# Patient Record
Sex: Female | Born: 1960 | Race: White | Hispanic: No | Marital: Married | Smoking: Former smoker
Health system: Southern US, Community
[De-identification: ages and names within clinical notes are randomized; demographics above are authoritative.]

## PROBLEM LIST (undated history)

## (undated) DIAGNOSIS — K219 Gastro-esophageal reflux disease without esophagitis: Secondary | ICD-10-CM

## (undated) DIAGNOSIS — F419 Anxiety disorder, unspecified: Secondary | ICD-10-CM

## (undated) DIAGNOSIS — F329 Major depressive disorder, single episode, unspecified: Secondary | ICD-10-CM

## (undated) DIAGNOSIS — K589 Irritable bowel syndrome without diarrhea: Secondary | ICD-10-CM

## (undated) DIAGNOSIS — D649 Anemia, unspecified: Secondary | ICD-10-CM

## (undated) DIAGNOSIS — F32A Depression, unspecified: Secondary | ICD-10-CM

## (undated) DIAGNOSIS — K56609 Unspecified intestinal obstruction, unspecified as to partial versus complete obstruction: Secondary | ICD-10-CM

## (undated) DIAGNOSIS — S0300XA Dislocation of jaw, unspecified side, initial encounter: Secondary | ICD-10-CM

## (undated) DIAGNOSIS — C539 Malignant neoplasm of cervix uteri, unspecified: Secondary | ICD-10-CM

## (undated) DIAGNOSIS — M1990 Unspecified osteoarthritis, unspecified site: Secondary | ICD-10-CM

## (undated) DIAGNOSIS — K602 Anal fissure, unspecified: Secondary | ICD-10-CM

## (undated) DIAGNOSIS — E785 Hyperlipidemia, unspecified: Secondary | ICD-10-CM

## (undated) DIAGNOSIS — I10 Essential (primary) hypertension: Secondary | ICD-10-CM

## (undated) DIAGNOSIS — H409 Unspecified glaucoma: Secondary | ICD-10-CM

## (undated) HISTORY — DX: Anxiety disorder, unspecified: F41.9

## (undated) HISTORY — DX: Unspecified glaucoma: H40.9

## (undated) HISTORY — DX: Major depressive disorder, single episode, unspecified: F32.9

## (undated) HISTORY — DX: Hyperlipidemia, unspecified: E78.5

## (undated) HISTORY — PX: MYOMECTOMY: SHX85

## (undated) HISTORY — DX: Irritable bowel syndrome, unspecified: K58.9

## (undated) HISTORY — DX: Anal fissure, unspecified: K60.2

## (undated) HISTORY — DX: Depression, unspecified: F32.A

## (undated) HISTORY — PX: COLECTOMY: SHX59

## (undated) HISTORY — DX: Dislocation of jaw, unspecified side, initial encounter: S03.00XA

## (undated) HISTORY — PX: CERVICAL CONE BIOPSY: SUR198

## (undated) HISTORY — DX: Unspecified intestinal obstruction, unspecified as to partial versus complete obstruction: K56.609

## (undated) HISTORY — DX: Essential (primary) hypertension: I10

## (undated) HISTORY — DX: Unspecified osteoarthritis, unspecified site: M19.90

## (undated) HISTORY — DX: Anemia, unspecified: D64.9

## (undated) HISTORY — DX: Malignant neoplasm of cervix uteri, unspecified: C53.9

## (undated) HISTORY — PX: ANAL FISTULECTOMY: SHX1139

## (undated) HISTORY — DX: Gastro-esophageal reflux disease without esophagitis: K21.9

---

## 1999-08-27 ENCOUNTER — Observation Stay (HOSPITAL_COMMUNITY): Admission: EM | Admit: 1999-08-27 | Discharge: 1999-08-28 | Payer: Self-pay | Admitting: Emergency Medicine

## 1999-09-01 ENCOUNTER — Encounter (INDEPENDENT_AMBULATORY_CARE_PROVIDER_SITE_OTHER): Payer: Self-pay

## 1999-09-01 ENCOUNTER — Other Ambulatory Visit: Admission: RE | Admit: 1999-09-01 | Discharge: 1999-09-01 | Payer: Self-pay | Admitting: Internal Medicine

## 2001-11-30 DIAGNOSIS — D649 Anemia, unspecified: Secondary | ICD-10-CM

## 2001-11-30 HISTORY — DX: Anemia, unspecified: D64.9

## 2002-11-13 ENCOUNTER — Other Ambulatory Visit: Admission: RE | Admit: 2002-11-13 | Discharge: 2002-11-13 | Payer: Self-pay | Admitting: Family Medicine

## 2007-05-04 ENCOUNTER — Other Ambulatory Visit: Admission: RE | Admit: 2007-05-04 | Discharge: 2007-05-04 | Payer: Self-pay | Admitting: Family Medicine

## 2011-02-17 ENCOUNTER — Other Ambulatory Visit (HOSPITAL_COMMUNITY)
Admission: RE | Admit: 2011-02-17 | Discharge: 2011-02-17 | Disposition: A | Discharge status: Home / Self Care | Payer: BC Managed Care – PPO | Source: Ambulatory Visit | Attending: Family Medicine | Admitting: Family Medicine

## 2011-02-17 ENCOUNTER — Other Ambulatory Visit: Payer: Self-pay | Admitting: Family Medicine

## 2011-02-17 DIAGNOSIS — Z1159 Encounter for screening for other viral diseases: Secondary | ICD-10-CM | POA: Insufficient documentation

## 2011-02-17 DIAGNOSIS — Z124 Encounter for screening for malignant neoplasm of cervix: Secondary | ICD-10-CM | POA: Insufficient documentation

## 2012-04-22 ENCOUNTER — Encounter: Payer: Self-pay | Admitting: Internal Medicine

## 2012-06-06 ENCOUNTER — Ambulatory Visit (INDEPENDENT_AMBULATORY_CARE_PROVIDER_SITE_OTHER): Payer: BC Managed Care – PPO | Admitting: Internal Medicine

## 2012-06-06 ENCOUNTER — Encounter: Payer: Self-pay | Admitting: Internal Medicine

## 2012-06-06 VITALS — BP 144/82 | HR 76 | Ht 64.0 in | Wt 142.6 lb

## 2012-06-06 DIAGNOSIS — R197 Diarrhea, unspecified: Secondary | ICD-10-CM

## 2012-06-06 DIAGNOSIS — R159 Full incontinence of feces: Secondary | ICD-10-CM

## 2012-06-06 DIAGNOSIS — K219 Gastro-esophageal reflux disease without esophagitis: Secondary | ICD-10-CM

## 2012-06-06 MED ORDER — CHOLESTYRAMINE 4 G PO PACK: PACK | ORAL | Status: DC

## 2012-06-06 NOTE — Progress Notes (Signed)
HISTORY OF PRESENT ILLNESS:  Natasha Chambers is a 51 y.o. female who is sent today regarding chronic diarrhea and fecal incontinence. Patient tells me that she has a history of an eating disorder remotely. She used laxatives chronically. Subsequently developed problems with chronic constipation and recurrent fecal impactions. Was evaluated by physicians in [place] approximately 20 years ago. Apparently had a grossly abnormal sitz marker test. 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 Ever since, she has had chronic diarrhea. 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 Ever since that time, she has had incontinence. She was apparently seen in this office about 10 years ago (old records purged, requested). She has a number of psychiatric issues including anxiety and depression. She does see a psychiatrist. She has stopped working and has applied for disability. She has tried Imodium and fiber. This will improve her bowel habits. However she describes some abdominal discomfort and fear of impaction. Subsequently has explosive bowel movements which are loose. On average, she states that she makes about 15 trips to the bathroom. Generally small volume bowel movements. She has had no weight loss. Review of outside records from last month showed a normal comprehensive metabolic panel. Throughout the course of the interview the patient rambles and is tearful. GI review of systems is essentially positive in its entirety except for dysphagia and GI bleeding. She does have chronic GERD for which she takes Prilosec. This is effective.  REVIEW OF SYSTEMS:  All non-GI ROS negative except for anxiety, arthritis, neck pain, visual change due to macular degeneration, confusion, depression, fatigue, insomnia, excessive thirst  Past Medical History  Diagnosis Date  . IBS (irritable bowel syndrome)   . Anemia 2003  . Arthritis     . Cervical cancer   . Anxiety and depression   . Hypertension     Past Surgical History  Procedure Date  . Colectomy   . Cervical cone biopsy   . Myomectomy   . Anal fistulectomy     Social History Natasha Chambers  reports that she has quit smoking. She has never used smokeless tobacco. She reports that she drinks alcohol. She reports that she does not use illicit drugs.  family history includes Diabetes in her brother and father; Heart disease in her father; Lung cancer in her mother; and Prostate cancer in her father.  No Known Allergies     PHYSICAL EXAMINATION: Vital signs: BP 144/82  Pulse 76  Ht 5\' 4"  (1.626 m)  Wt 142 lb 9.6 oz (64.683 kg)  BMI 24.48 kg/m2  Constitutional: generally well-appearing, no acute distress Psychiatric: alert and oriented x3, cooperative. Anxious and intermittently tearful Eyes: extraocular movements intact, anicteric, conjunctiva pink Mouth: oral pharynx moist, no lesions Neck: supple no lymphadenopathy Cardiovascular: heart regular rate and rhythm, no murmur Lungs: clear to auscultation bilaterally Abdomen: soft, nontender, nondistended, no obvious ascites, no peritoneal signs, normal bowel sounds, no organomegaly. Prior surgical incision well healed. No hernias. Rectal:no external abnormalities. Absent sphincter tone. Brown soft Hemoccult negative stool Extremities: no lower extremity edema bilaterally Skin: no lesions on visible extremities Neuro: No focal deficits.   ASSESSMENT:  #1. Diarrhea. This is secondary to total colectomy. This can be controlled with medications, though she will develop some abdominal discomfort and fears developing constipation #2. Fecal incontinence. This is secondary to lack of sphincter tone. May be the result of prior anal fistula problems or surgery #3. GERD. Controlled with PPI.  No alarm symptoms #4. Psychiatric issues including prior history of eating disorder and ongoing problems with anxiety  and depression. A major factor  PLAN:  #1. Discussed ways to try to improve stool consistency, but avoid constipation. A be titrating fiber or Imodium to lower doses as frequently. As well, I have prescribed Questran for her to try. This may be titrated as well. 45 minutes was spent with this patient today. The majority of the time counseling regarding her GI issues. #2. Where protective undergarments. As well schedule bathroom times #3. We did discuss ileostomy as an option. Also discussed that this would be anyone set of issues for a different set of issues. She is apparently aware of this option and has researched previously. Not interested at present #4. Reflux precautions and continue lowest dose of PPI to control heartburn #5. Continue to work with her psychiatrist #6. Return to the care of Dr. Clarene Duke. GI followup when necessary

## 2012-06-06 NOTE — Patient Instructions (Addendum)
We have sent the following medications to your pharmacy for you to pick up at your convenience: Questran 

## 2016-12-22 ENCOUNTER — Other Ambulatory Visit: Payer: Self-pay | Admitting: Dentistry

## 2016-12-22 DIAGNOSIS — R6884 Jaw pain: Secondary | ICD-10-CM

## 2016-12-26 ENCOUNTER — Ambulatory Visit
Admission: RE | Admit: 2016-12-26 | Discharge: 2016-12-26 | Disposition: A | Discharge status: Home / Self Care | Payer: Self-pay | Source: Ambulatory Visit | Attending: Dentistry | Admitting: Dentistry

## 2016-12-26 DIAGNOSIS — R6884 Jaw pain: Secondary | ICD-10-CM

## 2018-02-11 IMAGING — MR MR [PERSON_NAME]
7 series · 40 of 40 positions shown · non-contrast
Comparison: None.

CLINICAL DATA: Bilateral jaw pain, right greater than left, for 6
months.

EXAM:
MRI OF TEMPOROMANDIBULAR JOINT WITHOUT CONTRAST
TECHNIQUE: Multiplanar, multisequence MR imaging of the temporomandibular joint
was performed in the closed mouth position and using a 23 mm bite
block. No intravenous contrast was administered.

[Series 5: PD · coronal · 4.0mm · 0.44mm/px · 6 of 18 slices shown (1 of 5)]
[im 1/18]
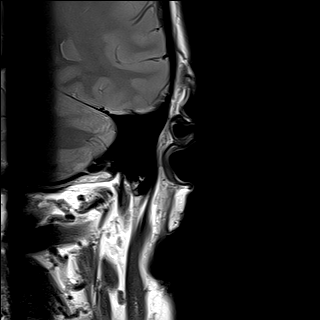
[im 4/18]
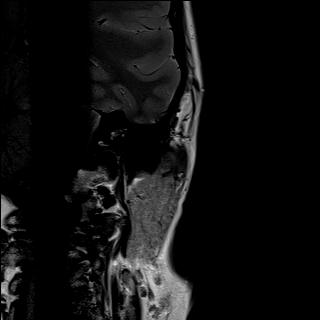
[im 7/18]
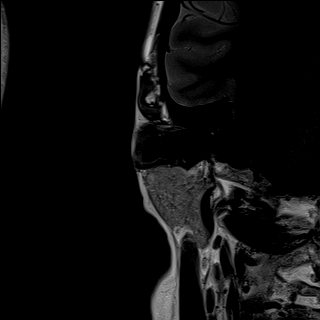
[im 11/18]
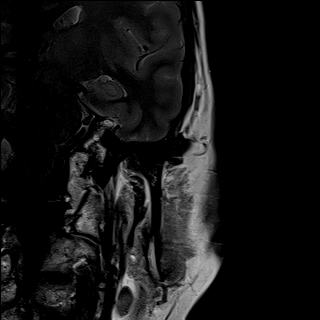
[im 14/18]
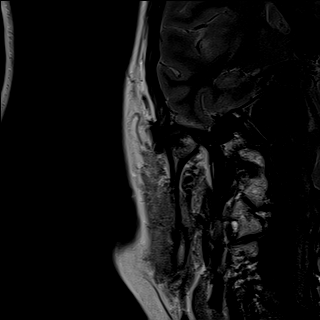
[im 18/18]
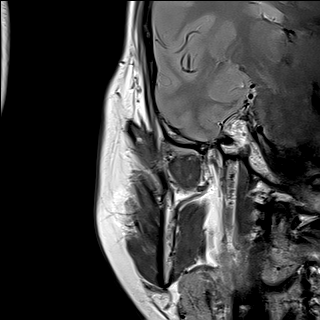

[Series 6: T2 fat-sat · sagittal · 4.0mm · 0.62mm/px · 6 of 18 slices shown]
[im 1/18]
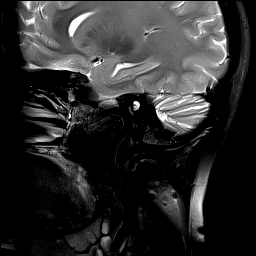
[im 4/18]
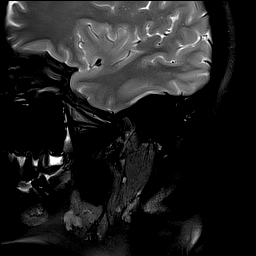
[im 7/18]
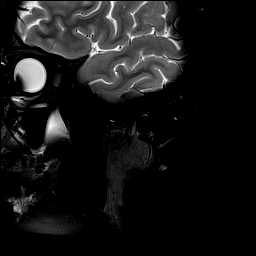
[im 11/18]
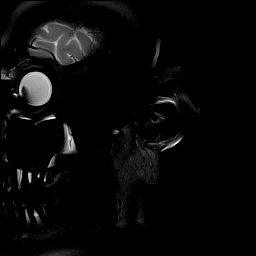
[im 14/18]
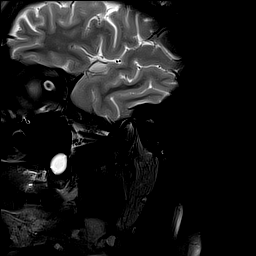
[im 18/18]
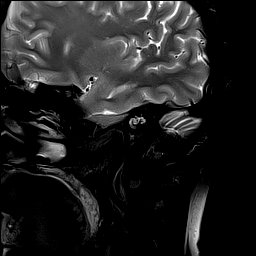

[Series 7: PD · sagittal · 4.0mm · 0.44mm/px · 6 of 18 slices shown (2 of 5)]
[im 1/18]
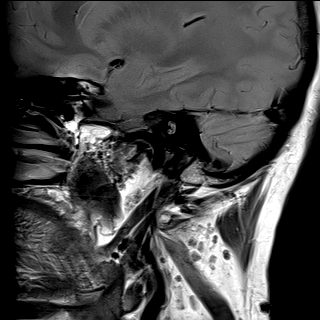
[im 4/18]
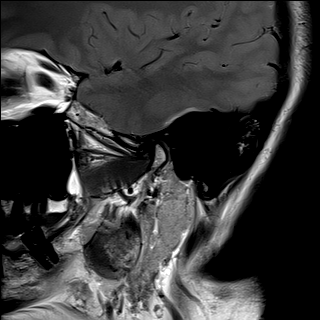
[im 7/18]
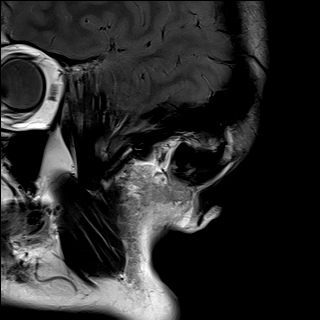
[im 11/18]
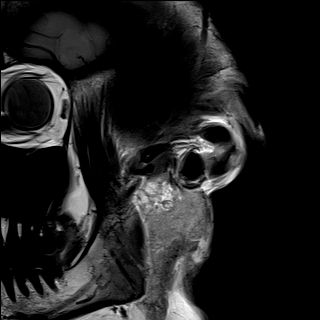
[im 14/18]
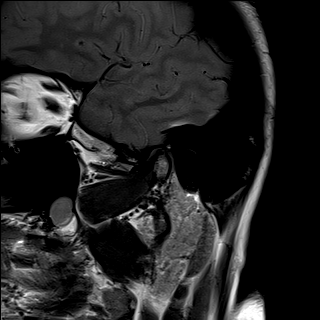
[im 18/18]
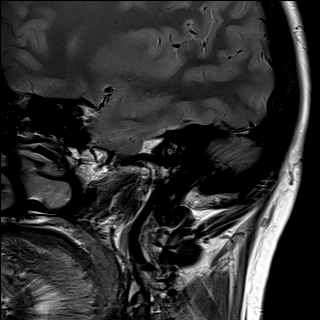

[Series 8: T1 · axial · 4.0mm · 0.59mm/px · z∈[-18,+30]mm · 4 of 13 slices shown]
[im 1/13]
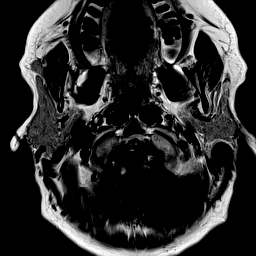
[im 5/13]
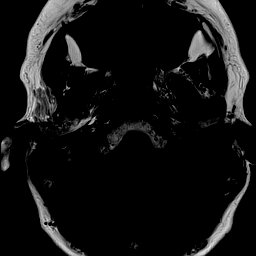
[im 9/13]
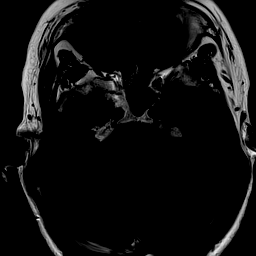
[im 13/13]
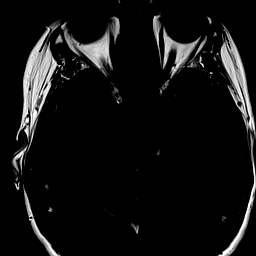

[Series 10: PD · sagittal · 4.0mm · 0.44mm/px · 6 of 18 slices shown (3 of 5)]
[im 1/18]
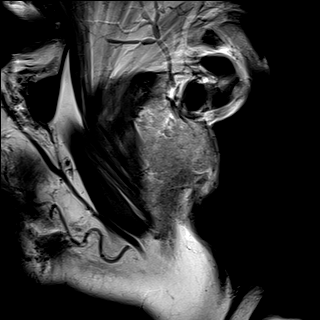
[im 4/18]
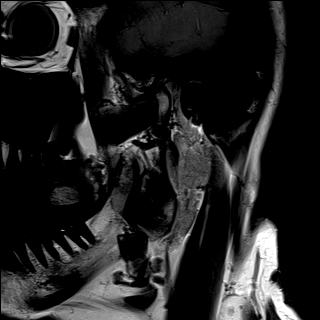
[im 7/18]
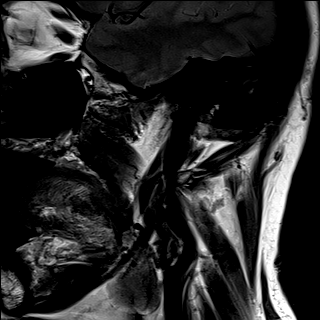
[im 11/18]
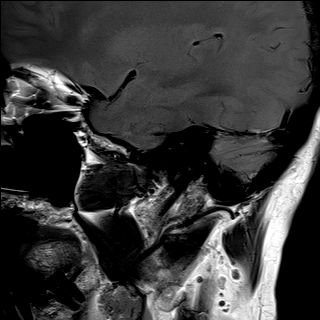
[im 14/18]
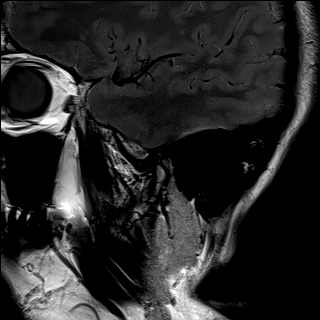
[im 18/18]
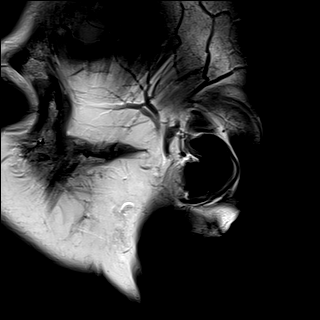

[Series 11: PD · coronal · 4.0mm · 0.44mm/px · 6 of 18 slices shown (4 of 5)]
[im 1/18]
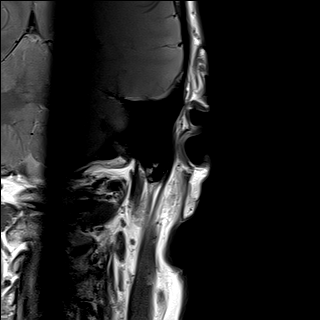
[im 4/18]
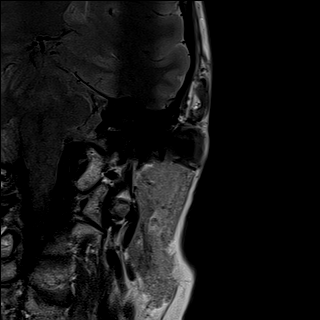
[im 7/18]
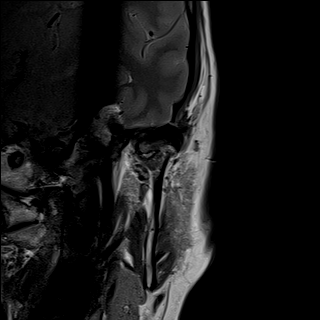
[im 11/18]
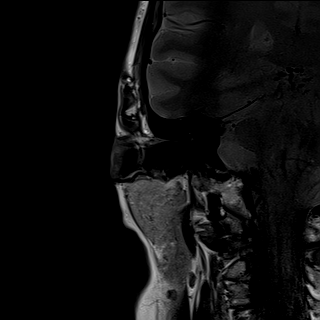
[im 14/18]
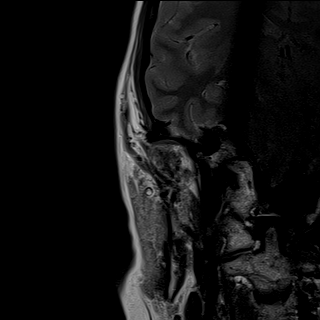
[im 18/18]
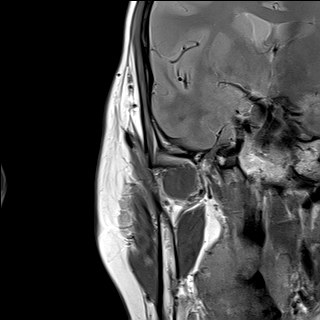

[Series 12: PD · sagittal · 4.0mm · 0.44mm/px · 6 of 18 slices shown (5 of 5)]
[im 1/18]
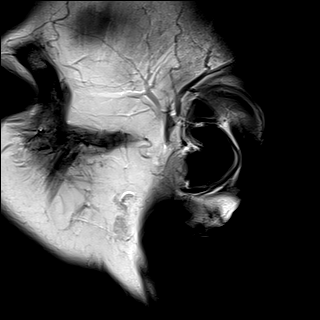
[im 4/18]
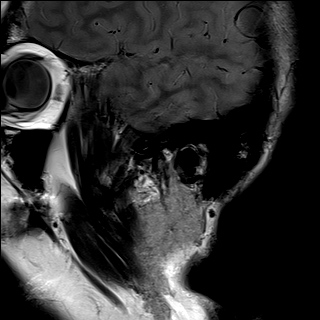
[im 7/18]
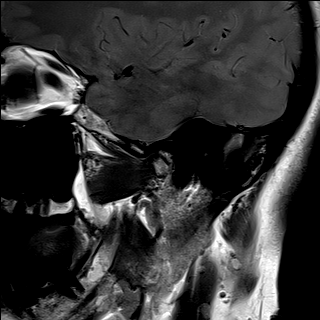
[im 11/18]
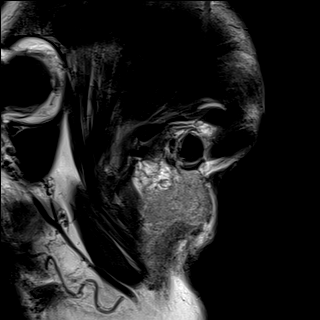
[im 14/18]
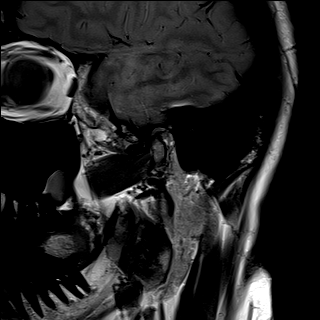
[im 18/18]
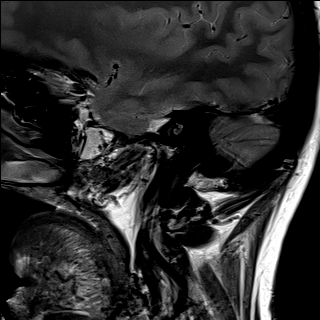

[40 of 40 positions shown; findings below may reference images not displayed]

FINDINGS: There is degenerative cortical irregularity along the right
mandibular condyles with subcortical marrow edema signal shown on
image [DATE] and adjacent images.

On the left side there no significant degenerative findings in the
mandibular condyle.

In the closed mouth position, the right TMJ articular disc is
considerably anteriorly displaced, as shown on images 3-4 of series
7. In the closed-mouth position on the left side the TMJ disc is
mildly abnormally anteriorly displaced.

At 23 mm of mouth opening, the there is only 3 mm of anterior
translation of the right mandibular condyles, and the TMJ disc is
not captured. On the left side, there is only 2 mm of condylar
anterior translation, but the TMJ disc has been captured as shown on
image [DATE].

Other findings include a mucous retention cyst in the right
maxillary sinus and a suspected small left mastoid effusion.
IMPRESSION: 1. Degenerative cortical irregularity and subcortical marrow edema
in the right mandibular condyle.
2. The right TMJ disc is anterior lead dislocated in the
closed-mouth position and remains anteriorly dislocated even at 23
mm of mouth opening. The left TMJ is mildly anteriorly dislocated in
the closed-mouth position, but is re- captured at the 23 mm mouth
opening position.
3. There is reduced anterior translation of both mandibular condyles
with mouth opening.

## 2018-02-18 ENCOUNTER — Other Ambulatory Visit (HOSPITAL_COMMUNITY)
Admission: RE | Admit: 2018-02-18 | Discharge: 2018-02-18 | Disposition: A | Discharge status: Home / Self Care | Payer: BLUE CROSS/BLUE SHIELD | Source: Ambulatory Visit | Attending: Nurse Practitioner | Admitting: Nurse Practitioner

## 2018-02-18 ENCOUNTER — Ambulatory Visit (INDEPENDENT_AMBULATORY_CARE_PROVIDER_SITE_OTHER): Payer: BLUE CROSS/BLUE SHIELD | Admitting: Nurse Practitioner

## 2018-02-18 ENCOUNTER — Encounter: Payer: Self-pay | Admitting: Nurse Practitioner

## 2018-02-18 ENCOUNTER — Telehealth: Payer: Self-pay | Admitting: Internal Medicine

## 2018-02-18 VITALS — BP 132/86 | HR 86 | Temp 98.5°F | Ht 64.0 in | Wt 148.4 lb

## 2018-02-18 DIAGNOSIS — Z0000 Encounter for general adult medical examination without abnormal findings: Secondary | ICD-10-CM | POA: Insufficient documentation

## 2018-02-18 DIAGNOSIS — K529 Noninfective gastroenteritis and colitis, unspecified: Secondary | ICD-10-CM | POA: Diagnosis not present

## 2018-02-18 DIAGNOSIS — Z23 Encounter for immunization: Secondary | ICD-10-CM | POA: Diagnosis not present

## 2018-02-18 DIAGNOSIS — N952 Postmenopausal atrophic vaginitis: Secondary | ICD-10-CM | POA: Insufficient documentation

## 2018-02-18 DIAGNOSIS — Z124 Encounter for screening for malignant neoplasm of cervix: Secondary | ICD-10-CM | POA: Insufficient documentation

## 2018-02-18 DIAGNOSIS — Z8739 Personal history of other diseases of the musculoskeletal system and connective tissue: Secondary | ICD-10-CM | POA: Insufficient documentation

## 2018-02-18 DIAGNOSIS — E782 Mixed hyperlipidemia: Secondary | ICD-10-CM | POA: Diagnosis not present

## 2018-02-18 DIAGNOSIS — Z0001 Encounter for general adult medical examination with abnormal findings: Secondary | ICD-10-CM | POA: Diagnosis not present

## 2018-02-18 DIAGNOSIS — Z9049 Acquired absence of other specified parts of digestive tract: Secondary | ICD-10-CM | POA: Diagnosis not present

## 2018-02-18 DIAGNOSIS — Z1159 Encounter for screening for other viral diseases: Secondary | ICD-10-CM

## 2018-02-18 LAB — CBC
HCT: 38.7 % (ref 36.0–46.0)
Hemoglobin: 13.1 g/dL (ref 12.0–15.0)
MCHC: 34 g/dL (ref 30.0–36.0)
MCV: 86.7 fl (ref 78.0–100.0)
PLATELETS: 272 10*3/uL (ref 150.0–400.0)
RBC: 4.46 Mil/uL (ref 3.87–5.11)
RDW: 12.8 % (ref 11.5–15.5)
WBC: 6.8 10*3/uL (ref 4.0–10.5)

## 2018-02-18 LAB — COMPREHENSIVE METABOLIC PANEL
ALBUMIN: 4.3 g/dL (ref 3.5–5.2)
ALT: 45 U/L — AB (ref 0–35)
AST: 23 U/L (ref 0–37)
Alkaline Phosphatase: 55 U/L (ref 39–117)
BILIRUBIN TOTAL: 0.5 mg/dL (ref 0.2–1.2)
BUN: 10 mg/dL (ref 6–23)
CALCIUM: 9.5 mg/dL (ref 8.4–10.5)
CO2: 27 mEq/L (ref 19–32)
CREATININE: 0.84 mg/dL (ref 0.40–1.20)
Chloride: 106 mEq/L (ref 96–112)
GFR: 74.07 mL/min (ref >60.00)
Glucose, Bld: 81 mg/dL (ref 70–99)
Potassium: 4.4 mEq/L (ref 3.5–5.1)
Sodium: 140 mEq/L (ref 135–145)
TOTAL PROTEIN: 6.8 g/dL (ref 6.0–8.3)

## 2018-02-18 LAB — LIPID PANEL
CHOLESTEROL: 213 mg/dL — AB (ref 0–200)
HDL: 58.2 mg/dL (ref >39.00)
NonHDL: 154.74
TRIGLYCERIDES: 244 mg/dL — AB (ref 0.0–149.0)
Total CHOL/HDL Ratio: 4
VLDL: 48.8 mg/dL — ABNORMAL HIGH (ref 0.0–40.0)

## 2018-02-18 LAB — TSH: TSH: 1.29 u[IU]/mL (ref 0.35–4.50)

## 2018-02-18 LAB — LDL CHOLESTEROL, DIRECT: Direct LDL: 109 mg/dL

## 2018-02-18 NOTE — Assessment & Plan Note (Addendum)
Secondary to colectomy, done 2000 (benign biopsy) Per patient current use of Questran. Recurrent dehydration with need for IV fluid rehydration

## 2018-02-18 NOTE — Patient Instructions (Addendum)
Stable CMP, CBC, TSH. Negative Hep. C/ Elevated lipid panel. Maintain heart healthy diet. Will repeat in 58months (fasting).  Sign medical release to get records from Dr. Rex Kras.  Dehydration, Adult Dehydration is a condition in which there is not enough fluid or water in the body. This happens when you lose more fluids than you take in. Important organs, such as the kidneys, brain, and heart, cannot function without a proper amount of fluids. Any loss of fluids from the body can lead to dehydration. Dehydration can range from mild to severe. This condition should be treated right away to prevent it from becoming severe. What are the causes? This condition may be caused by:  Vomiting.  Diarrhea.  Excessive sweating, such as from heat exposure or exercise.  Not drinking enough fluid, especially: ? When ill. ? While doing activity that requires a lot of energy.  Excessive urination.  Fever.  Infection.  Certain medicines, such as medicines that cause the body to lose excess fluid (diuretics).  Inability to access safe drinking water.  Reduced physical ability to get adequate water and food.  What increases the risk? This condition is more likely to develop in people:  Who have a poorly controlled long-term (chronic) illness, such as diabetes, heart disease, or kidney disease.  Who are age 19 or older.  Who are disabled.  Who live in a place with high altitude.  Who play endurance sports.  What are the signs or symptoms? Symptoms of mild dehydration may include:  Thirst.  Dry lips.  Slightly dry mouth.  Dry, warm skin.  Dizziness. Symptoms of moderate dehydration may include:  Very dry mouth.  Muscle cramps.  Dark urine. Urine may be the color of tea.  Decreased urine production.  Decreased tear production.  Heartbeat that is irregular or faster than normal (palpitations).  Headache.  Light-headedness, especially when you stand up from a sitting  position.  Fainting (syncope). Symptoms of severe dehydration may include:  Changes in skin, such as: ? Cold and clammy skin. ? Blotchy (mottled) or pale skin. ? Skin that does not quickly return to normal after being lightly pinched and released (poor skin turgor).  Changes in body fluids, such as: ? Extreme thirst. ? No tear production. ? Inability to sweat when body temperature is high, such as in hot weather. ? Very little urine production.  Changes in vital signs, such as: ? Weak pulse. ? Pulse that is more than 100 beats a minute when sitting still. ? Rapid breathing. ? Low blood pressure.  Other changes, such as: ? Sunken eyes. ? Cold hands and feet. ? Confusion. ? Lack of energy (lethargy). ? Difficulty waking up from sleep. ? Short-term weight loss. ? Unconsciousness. How is this diagnosed? This condition is diagnosed based on your symptoms and a physical exam. Blood and urine tests may be done to help confirm the diagnosis. How is this treated? Treatment for this condition depends on the severity. Mild or moderate dehydration can often be treated at home. Treatment should be started right away. Do not wait until dehydration becomes severe. Severe dehydration is an emergency and it needs to be treated in a hospital. Treatment for mild dehydration may include:  Drinking more fluids.  Replacing salts and minerals in your blood (electrolytes) that you may have lost. Treatment for moderate dehydration may include:  Drinking an oral rehydration solution (ORS). This is a drink that helps you replace fluids and electrolytes (rehydrate). It can be found at pharmacies and  retail stores. Treatment for severe dehydration may include:  Receiving fluids through an IV tube.  Receiving an electrolyte solution through a feeding tube that is passed through your nose and into your stomach (nasogastric tube, or NG tube).  Correcting any abnormalities in electrolytes.  Treating  the underlying cause of dehydration. Follow these instructions at home:  If directed by your health care provider, drink an ORS: ? Make an ORS by following instructions on the package. ? Start by drinking small amounts, about  cup (120 mL) every 5-10 minutes. ? Slowly increase how much you drink until you have taken the amount recommended by your health care provider.  Drink enough clear fluid to keep your urine clear or pale yellow. If you were told to drink an ORS, finish the ORS first, then start slowly drinking other clear fluids. Drink fluids such as: ? Water. Do not drink only water. Doing that can lead to having too little salt (sodium) in the body (hyponatremia). ? Ice chips. ? Fruit juice that you have added water to (diluted fruit juice). ? Low-calorie sports drinks.  Avoid: ? Alcohol. ? Drinks that contain a lot of sugar. These include high-calorie sports drinks, fruit juice that is not diluted, and soda. ? Caffeine. ? Foods that are greasy or contain a lot of fat or sugar.  Take over-the-counter and prescription medicines only as told by your health care provider.  Do not take sodium tablets. This can lead to having too much sodium in the body (hypernatremia).  Eat foods that contain a healthy balance of electrolytes, such as bananas, oranges, potatoes, tomatoes, and spinach.  Keep all follow-up visits as told by your health care provider. This is important. Contact a health care provider if:  You have abdominal pain that: ? Gets worse. ? Stays in one area (localizes).  You have a rash.  You have a stiff neck.  You are more irritable than usual.  You are sleepier or more difficult to wake up than usual.  You feel weak or dizzy.  You feel very thirsty.  You have urinated only a small amount of very dark urine over 6-8 hours. Get help right away if:  You have symptoms of severe dehydration.  You cannot drink fluids without vomiting.  Your symptoms get  worse with treatment.  You have a fever.  You have a severe headache.  You have vomiting or diarrhea that: ? Gets worse. ? Does not go away.  You have blood or green matter (bile) in your vomit.  You have blood in your stool. This may cause stool to look black and tarry.  You have not urinated in 6-8 hours.  You faint.  Your heart rate while sitting still is over 100 beats a minute.  You have trouble breathing. This information is not intended to replace advice given to you by your health care provider. Make sure you discuss any questions you have with your health care provider. Document Released: 11/16/2005 Document Revised: 06/12/2016 Document Reviewed: 01/10/2016 [employer]vier Interactive Patient Education  [employer].

## 2018-02-18 NOTE — Progress Notes (Signed)
Subjective:    Patient ID: Natasha Chambers, female    DOB: [birth year], 57 y.o.   MRN: [id number]  Patient presents today for complete physical   HPI  Previous pcp: Dr. Rex Kras Under his care for 11yrs Last OV was 2018.  Chronic diarrhea.(liquid to loose) Hx of stool incontinence. 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 Use of questran prn Previous GI (Dr. Henrene Pastor) Needs referral for another [facility] provider.  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  Immunizations: (TDAP, Hep C screen, Pneumovax, Influenza, zoster)  Health Maintenance  Topic Date Due  . Colon Cancer Screening  05/01/2010  . Pap Smear  02/16/2014  . HIV Screening  02/19/2019*  . Mammogram  12/01/2019  . Tetanus Vaccine  02/19/2028  . Flu Shot  Completed  .  Hepatitis C: One time screening is recommended by Center for Disease Control  (CDC) for  adults born from [birth year] through 1966.   Completed  *Topic was postponed. The date shown is not the original due date.   Diet:regular.  Weight:  Wt Readings from Last 3 Encounters:  02/18/18 148 lb 6.4 oz (67.3 kg)  06/06/12 142 lb 9.6 oz (64.7 kg)   Exercise:none.  Fall Risk: Fall Risk  02/18/2018  Falls in the past year? Yes  Number falls in past yr: 1  Injury with Fall? No  Risk for fall due to : Other (Comment)  Risk for fall due to: Comment fall due to dehydration  Follow up Falls prevention discussed   Home Safety:home with husband.  Pap Smear (every 25yrs for >21-29 without HPV, every 73yrs for >30-23yrs with HPV):needed.  Mammogram (yearly, >67yrs):up to date per patient. Report requested.  Vision:up to date, done by Dr. Shirley Muscat.  Dental:up to date, done by Dr. Trenton Gammon.   Medications and allergies reviewed with patient and updated if appropriate.  Patient  Active Problem List   Diagnosis Date Noted  . Chronic diarrhea 02/18/2018  . S/P colectomy 02/18/2018  . Vaginal atrophy 02/18/2018  . 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 cholestyramine (QUESTRAN) 4 G packet Take one packet twice a day; do not take within 2 hours of other medications 60 each 6  . Multiple Vitamins-Minerals (MULTIVITAMIN PO) Take 1 tablet by mouth daily.    . fish oil-omega-3 fatty acids 1000 MG capsule Take 1,000 g by mouth daily.    Marland Kitchen FLUoxetine (PROZAC) 20 MG tablet Take 20 mg by mouth daily.    Marland Kitchen omeprazole (PRILOSEC) 20 MG capsule Take 20 mg by mouth daily.    . Probiotic Product (PROBIOTIC DAILY PO) Take 1 tablet by mouth daily.     No current facility-administered medications on file prior to visit.     Past Medical History:  Diagnosis Date  . Anemia 2003  . Anxiety and depression   . Arthritis   . Cervical cancer ([place])   . GERD (gastroesophageal reflux disease)   . Hypertension   . IBS (irritable bowel syndrome)     Past Surgical History:  Procedure Laterality Date  . ANAL FISTULECTOMY    . CERVICAL CONE BIOPSY    . COLECTOMY     total colectomy secondary to perforation   . MYOMECTOMY  Social History   Socioeconomic History  . Marital status: Married    Spouse name: Not on file  . Number of children: Not on file  . Years of education: Not on file  . Highest education level: Not on file  Occupational History  . Occupation: Retired  Scientific laboratory technician  . Financial resource strain: Not on file  . Food insecurity:    Worry: Not on file    Inability: Not on file  . Transportation needs:    Medical: Not on file    Non-medical: Not on file  Tobacco Use  . Smoking status: Former Research scientist (life sciences)  . Smokeless tobacco: Never Used  Substance and Sexual Activity  . Alcohol use: Not Currently  . Drug use: No  . Sexual activity: Not Currently  Lifestyle  .  Physical activity:    Days per week: Not on file    Minutes per session: Not on file  . Stress: Not on file  Relationships  . Social connections:    Talks on phone: Not on file    Gets together: Not on file    Attends religious service: Not on file    Active member of club or organization: Not on file    Attends meetings of clubs or organizations: Not on file    Relationship status: Not on file  Other Topics Concern  . Not on file  Social History Narrative  . Not on file    Family History  Problem Relation Age of Onset  . Prostate cancer Father   . Diabetes Father   . Heart disease Father   . Depression Father   . Alcohol abuse Father   . Arthritis Father   . Cancer Father   . Early death Father   . Hearing loss Father   . Hyperlipidemia Father   . Hypertension Father   . Diabetes Brother   . Depression Brother   . Hypertension Brother   . Lung cancer Mother   . Depression Mother   . Cancer Mother   . COPD Mother   . Early death Mother   . Hypertension Mother   . Cancer Paternal Uncle        lung  . Cancer Paternal Grandfather        lung  . Cancer Maternal Grandmother   . Early death Maternal Grandmother   . Cancer Maternal Grandfather   . Cancer Paternal Grandmother         Review of Systems  Constitutional: Negative for fever, malaise/fatigue and weight loss.  HENT: Negative for congestion and sore throat.   Eyes:       Negative for visual changes  Respiratory: Negative for cough and shortness of breath.   Cardiovascular: Negative for chest pain, palpitations and leg swelling.  Gastrointestinal: Positive for diarrhea. Negative for abdominal pain, blood in stool, constipation, heartburn and melena.  Genitourinary: Negative for dysuria, frequency and urgency.  Musculoskeletal: Negative for falls, joint pain and myalgias.  Skin: Negative for rash.  Neurological: Negative for dizziness, sensory change and headaches.  Endo/Heme/Allergies: Does not  bruise/bleed easily.    Objective:   Vitals:   02/18/18 0902  BP: 132/86  Pulse: 86  Temp: 98.5 F (36.9 C)  SpO2: 97%    Body mass index is 25.47 kg/m.   Physical Examination:  Physical Exam  Constitutional: She is oriented to person, place, and time and well-developed, well-nourished, and in no distress. No distress.  HENT:  Right Ear: External ear normal.  Left Ear: External ear normal.  Nose: Nose normal.  Mouth/Throat: Oropharynx is clear and moist. No oropharyngeal exudate.  Eyes: Pupils are equal, round, and reactive to light. Conjunctivae and EOM are normal. No scleral icterus.  Neck: Normal range of motion. Neck supple. No thyromegaly present.  Cardiovascular: Normal rate, regular rhythm, normal heart sounds and intact distal pulses.  Pulmonary/Chest: Effort normal and breath sounds normal. She exhibits no tenderness. Right breast exhibits no mass, no nipple discharge and no skin change. Left breast exhibits no mass, no nipple discharge and no skin change. Breasts are symmetrical.  Abdominal: Soft. Bowel sounds are normal. She exhibits no distension. There is no tenderness.  Genitourinary: Vagina normal, cervix normal, right adnexa normal, left adnexa normal and vulva normal. Rectal exam shows external hemorrhoid. Cervix exhibits no motion tenderness. No vaginal discharge found.  Genitourinary Comments: Atrophy vaginal walls  Musculoskeletal: Normal range of motion. She exhibits no edema or tenderness.  Lymphadenopathy:    She has no cervical adenopathy.  Neurological: She is alert and oriented to person, place, and time. Gait normal.  Skin: Skin is warm and dry.  Psychiatric: Affect and judgment normal.  Vitals reviewed.   ASSESSMENT and PLAN:  [place] was seen today for establish care.  Diagnoses and all orders for this visit:  Encounter for preventative adult health care examination -     CBC -     Comprehensive metabolic panel -     Lipid panel -      TSH -     Cytology - PAP -     Hepatitis C Antibody  Encounter for lipid screening for cardiovascular disease -     Lipid panel  Encounter for hepatitis C screening test for low risk patient -     Hepatitis C Antibody  Encounter for Papanicolaou smear for cervical cancer screening -     Cytology - PAP  Need for Tdap vaccination -     Tdap vaccine greater than or equal to 7yo IM  S/P colectomy -     Ambulatory referral to Gastroenterology  Chronic diarrhea -     Ambulatory referral to Gastroenterology  Other orders -     LDL cholesterol, direct   Chronic diarrhea Secondary to colectomy, done 2000 (benign biopsy) Per patient current use of Questran. Recurrent dehydration with need for IV fluid rehydration    Recent Results (from the past 2160 hour(s))  CBC     Status: None   Collection Time: 02/18/18 10:00 AM  Result Value Ref Range   WBC 6.8 4.0 - 10.5 K/uL   RBC 4.46 3.87 - 5.11 Mil/uL   Platelets 272.0 150.0 - 400.0 K/uL   Hemoglobin 13.1 12.0 - 15.0 g/dL   HCT 38.7 36.0 - 46.0 %   MCV 86.7 78.0 - 100.0 fl   MCHC 34.0 30.0 - 36.0 g/dL   RDW 12.8 11.5 - 15.5 %  Comprehensive metabolic panel     Status: Abnormal   Collection Time: 02/18/18 10:00 AM  Result Value Ref Range   Sodium 140 135 - 145 mEq/L   Potassium 4.4 3.5 - 5.1 mEq/L   Chloride 106 96 - 112 mEq/L   CO2 27 19 - 32 mEq/L   Glucose, Bld 81 70 - 99 mg/dL   BUN 10 6 - 23 mg/dL   Creatinine, Ser 0.84 0.40 - 1.20 mg/dL   Total Bilirubin 0.5 0.2 - 1.2 mg/dL   Alkaline Phosphatase 55 39 - 117 U/L   AST  23 0 - 37 U/L   ALT 45 (H) 0 - 35 U/L   Total Protein 6.8 6.0 - 8.3 g/dL   Albumin 4.3 3.5 - 5.2 g/dL   Calcium 9.5 8.4 - 10.5 mg/dL   GFR 74.07 >60.00 mL/min  Lipid panel     Status: Abnormal   Collection Time: 02/18/18 10:00 AM  Result Value Ref Range   Cholesterol 213 (H) 0 - 200 mg/dL    Comment: ATP III Classification       Desirable:  < 200 mg/dL               Borderline High:  200 - 239  mg/dL          High:  > = 240 mg/dL   Triglycerides 244.0 (H) 0.0 - 149.0 mg/dL    Comment: Normal:  <150 mg/dLBorderline High:  150 - 199 mg/dL   HDL 58.20 >39.00 mg/dL   VLDL 48.8 (H) 0.0 - 40.0 mg/dL   Total CHOL/HDL Ratio 4     Comment:                Men          Women1/2 Average Risk     3.4          3.3Average Risk          5.0          4.42X Average Risk          9.6          7.13X Average Risk          15.0          11.0                       NonHDL 154.74     Comment: NOTE:  Non-HDL goal should be 30 mg/dL higher than patient's LDL goal (i.e. LDL goal of < 70 mg/dL, would have non-HDL goal of < 100 mg/dL)  TSH     Status: None   Collection Time: 02/18/18 10:00 AM  Result Value Ref Range   TSH 1.29 0.35 - 4.50 uIU/mL  Hepatitis C Antibody     Status: None   Collection Time: 02/18/18 10:00 AM  Result Value Ref Range   Hepatitis C Ab NON-REACTIVE NON-REACTI   SIGNAL TO CUT-OFF 0.30 <1.00    Comment: . HCV antibody was non-reactive. There is no laboratory  evidence of HCV infection. . In most cases, no further action is required. However, if recent HCV exposure is suspected, a test for HCV RNA (test code [phone]) is suggested. . For additional information please refer to http://education.questdiagnostics.com/faq/FAQ22v1 (This link is being provided for informational/ educational purposes only.) .   LDL cholesterol, direct     Status: None   Collection Time: 02/18/18 10:00 AM  Result Value Ref Range   Direct LDL 109.0 mg/dL    Comment: Optimal:  <100 mg/dLNear or Above Optimal:  100-129 mg/dLBorderline High:  130-159 mg/dLHigh:  160-189 mg/dLVery High:  >190 mg/dL   Follow up: Return if symptoms worsen or fail to improve.  Wilfred Lacy, NP

## 2018-02-20 LAB — HEPATITIS C ANTIBODY
Hepatitis C Ab: NONREACTIVE
SIGNAL TO CUT-OFF: 0.3 (ref <1.00)

## 2018-02-21 ENCOUNTER — Encounter: Payer: Self-pay | Admitting: Nurse Practitioner

## 2018-02-21 NOTE — Telephone Encounter (Signed)
sure

## 2018-02-21 NOTE — Telephone Encounter (Signed)
ok 

## 2018-02-21 NOTE — Telephone Encounter (Signed)
Dr. Silverio Decamp please see notes below. Will you accept this pt?

## 2018-02-21 NOTE — Telephone Encounter (Signed)
See comments from [place], ok to schedule.

## 2018-02-22 ENCOUNTER — Encounter: Payer: Self-pay | Admitting: Gastroenterology

## 2018-02-22 LAB — CYTOLOGY - PAP
DIAGNOSIS: NEGATIVE
HPV (WINDOPATH): NOT DETECTED

## 2018-02-22 NOTE — Telephone Encounter (Signed)
Office visit scheduled with Dr. Silverio Decamp

## 2018-04-12 ENCOUNTER — Encounter: Payer: Self-pay | Admitting: Gastroenterology

## 2018-04-12 ENCOUNTER — Ambulatory Visit (INDEPENDENT_AMBULATORY_CARE_PROVIDER_SITE_OTHER): Payer: BLUE CROSS/BLUE SHIELD | Admitting: Gastroenterology

## 2018-04-12 VITALS — BP 132/80 | HR 76 | Ht 64.17 in | Wt 147.4 lb

## 2018-04-12 DIAGNOSIS — R197 Diarrhea, unspecified: Secondary | ICD-10-CM

## 2018-04-12 DIAGNOSIS — K602 Anal fissure, unspecified: Secondary | ICD-10-CM | POA: Diagnosis not present

## 2018-04-12 DIAGNOSIS — R159 Full incontinence of feces: Secondary | ICD-10-CM

## 2018-04-12 DIAGNOSIS — K6289 Other specified diseases of anus and rectum: Secondary | ICD-10-CM | POA: Diagnosis not present

## 2018-04-12 DIAGNOSIS — Z9049 Acquired absence of other specified parts of digestive tract: Secondary | ICD-10-CM

## 2018-04-12 MED ORDER — AMBULATORY NON FORMULARY MEDICATION: 1 refills | Status: DC

## 2018-04-12 MED ORDER — DIPHENOXYLATE-ATROPINE 2.5-0.025 MG PO TABS: 1.0000 | ORAL_TABLET | Freq: Four times a day (QID) | ORAL | 2 refills | Status: DC

## 2018-04-12 MED ORDER — COLESTIPOL HCL 1 G PO TABS: 2.0000 g | ORAL_TABLET | Freq: Three times a day (TID) | ORAL | 3 refills | Status: DC

## 2018-04-12 MED ORDER — HYOSCYAMINE SULFATE SL 0.125 MG SL SUBL: SUBLINGUAL_TABLET | SUBLINGUAL | 0 refills | Status: DC

## 2018-04-12 NOTE — Patient Instructions (Addendum)
You have been scheduled for a flexible sigmoidoscopy. Please follow the written instructions given to you at your visit today. If you use inhalers (even only as needed), please bring them with you on the day of your procedure.   We have sent Colestid, Lomotil, (Xifaxian Samples) and Nitroglycerin to your pharmacy  Use Benefiber 1 tablespoon three times a day  You can purchase OTC VSL #3 112 Units to use twice a day for 6-8 weeks   If you are age 58 or older, your body mass index should be between 23-30. Your Body mass index is 25.16 kg/m. If this is out of the aforementioned range listed, please consider follow up with your Primary Care Provider.  If you are age 46 or younger, your body mass index should be between 19-25. Your Body mass index is 25.16 kg/m. If this is out of the aformentioned range listed, please consider follow up with your Primary Care Provider.

## 2018-04-12 NOTE — Progress Notes (Signed)
Natasha Chambers    [id number]    [birth year]  Primary Care Physician:Nche, Charlene Brooke, NP  Referring Physician: Flossie Buffy, NP [address] [place], [place] [address]  Chief complaint:  Diarrhea  HPI:  77 yr F previously seen by Dr Henrene Pastor in July 2013 is here to reestablish care after she requested switching providers. S/p Sub total colectomy in [place] at age 57 and since then she has chronic diarrhea She goes multiple times after each meal, about 2-3 times after every meal, approximately about 10 times in a day. Its loose, but thick and pasty.  About every 3 months she has loose watery diarrhea associated with nausea. She gets dehydrated when this happens She is taking probiotics on and off She doesn't have urgency but has intermittent fecal incontinence worse when its liquid stool.  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  Subtotal colectomy at age 39 for severe constipation with recurrent fecal impactions.  No recent flex sig or rectal cancer screening Denies any nausea, vomiting, abdominal pain, melena or bright red blood per rectum  Outpatient Encounter Medications as of 04/12/2018  Medication Sig  . cholestyramine (QUESTRAN) 4 G packet Take one packet twice a day; do not take within 2 hours of other medications  . fish oil-omega-3 fatty acids 1000 MG capsule Take 1,000 g by mouth daily.  Marland Kitchen FLUoxetine (PROZAC) 20 MG tablet Take 20 mg by mouth daily.  . Multiple Vitamins-Minerals (MULTIVITAMIN PO) Take 1 tablet by mouth daily.  Marland Kitchen omeprazole (PRILOSEC) 20 MG capsule Take 20 mg by mouth daily.  . Probiotic Product (PROBIOTIC DAILY PO) Take 1 tablet by mouth daily.   No facility-administered encounter medications on file as of 04/12/2018.     Allergies as of 04/12/2018  . (No Known Allergies)    Past Medical History:  Diagnosis Date  . Anemia 2003  . Anxiety and depression   . Arthritis   . Cervical cancer ([place])     . GERD (gastroesophageal reflux disease)   . Hypertension   . IBS (irritable bowel syndrome)     Past Surgical History:  Procedure Laterality Date  . ANAL FISTULECTOMY    . CERVICAL CONE BIOPSY    . COLECTOMY     total colectomy secondary to perforation   . MYOMECTOMY      Family History  Problem Relation Age of Onset  . Prostate cancer Father   . Diabetes Father   . Heart disease Father   . Depression Father   . Alcohol abuse Father   . Arthritis Father   . Cancer Father   . Early death Father   . Hearing loss Father   . Hyperlipidemia Father   . Hypertension Father   . Diabetes Brother   . Depression Brother   . Hypertension Brother   . Lung cancer Mother   . Depression Mother   . Cancer Mother   . COPD Mother   . Early death Mother   . Hypertension Mother   . Cancer Paternal Uncle        lung  . Cancer Paternal Grandfather        lung  . Cancer Maternal Grandmother   . Early death Maternal Grandmother   . Cancer Maternal Grandfather   . Cancer Paternal Grandmother     Social History   Socioeconomic History  . Marital status: Married  Spouse name: Not on file  . Number of children: Not on file  . Years of education: Not on file  . Highest education level: Not on file  Occupational History  . Occupation: Retired  Scientific laboratory technician  . Financial resource strain: Not on file  . Food insecurity:    Worry: Not on file    Inability: Not on file  . Transportation needs:    Medical: Not on file    Non-medical: Not on file  Tobacco Use  . Smoking status: Former Research scientist (life sciences)  . Smokeless tobacco: Never Used  Substance and Sexual Activity  . Alcohol use: Not Currently  . Drug use: No  . Sexual activity: Not Currently  Lifestyle  . Physical activity:    Days per week: Not on file    Minutes per session: Not on file  . Stress: Not on file  Relationships  . Social connections:    Talks on phone: Not on file    Gets together: Not on file    Attends religious  service: Not on file    Active member of club or organization: Not on file    Attends meetings of clubs or organizations: Not on file    Relationship status: Not on file  . Intimate partner violence:    Fear of current or ex partner: Not on file    Emotionally abused: Not on file    Physically abused: Not on file    Forced sexual activity: Not on file  Other Topics Concern  . Not on file  Social History Narrative  . Not on file      Review of systems: Review of Systems  Constitutional: Negative for fever and chills.  Positive for fatigue HENT: Negative.   Eyes: Negative for blurred vision.  Respiratory: Negative for cough, shortness of breath and wheezing.   Cardiovascular: Negative for chest pain and palpitations.  Gastrointestinal: as per HPI Genitourinary: Negative for dysuria, urgency, frequency and hematuria.  Musculoskeletal: Negative for myalgias, back pain and joint pain.  Skin: Negative for itching and rash.  Neurological: Negative for dizziness, tremors, focal weakness, seizures and loss of consciousness.  Endo/Heme/Allergies: Negative for seasonal allergies.  Psychiatric/Behavioral: Negative for depression, suicidal ideas and hallucinations.  All other systems reviewed and are negative.   Physical Exam: Vitals:   04/12/18 0820  BP: 132/80  Pulse: 76   Body mass index is 25.16 kg/m. Gen:      No acute distress HEENT:  EOMI, sclera anicteric Neck:     No masses; no thyromegaly Lungs:    Clear to auscultation bilaterally; normal respiratory effort CV:         Regular rate and rhythm; no murmurs Abd:      + bowel sounds; soft, non-tender; no palpable masses, no distension Ext:    No edema; adequate peripheral perfusion Skin:      Warm and dry; no rash Neuro: alert and oriented x 3 Psych: normal mood and affect Rectal exam: Increased anal sphincter tone with tenderness, + anal fissure , no external hemorrhoids Anoscopy not performed  Data  Reviewed:  Reviewed labs, radiology imaging, old records and pertinent past GI work up   Assessment and Plan/Recommendations:  57 year old female status post subtotal colectomy for severe constipation with ileorectal anastomosis here with complaints of diarrhea  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 All questions answered. The patient agrees to proceed.  Greater than 50% of the time used for counseling as well as treatment plan and follow-up. She had multiple questions which were answered to her satisfaction  K. Denzil Magnuson , MD [phone]    CC: Nche, Charlene Brooke, NP

## 2018-04-14 ENCOUNTER — Encounter: Payer: Self-pay | Admitting: Gastroenterology

## 2018-04-14 ENCOUNTER — Other Ambulatory Visit: Payer: Self-pay

## 2018-04-14 ENCOUNTER — Telehealth: Payer: Self-pay | Admitting: Gastroenterology

## 2018-04-14 ENCOUNTER — Ambulatory Visit (AMBULATORY_SURGERY_CENTER): Payer: BLUE CROSS/BLUE SHIELD | Admitting: Gastroenterology

## 2018-04-14 VITALS — BP 128/75 | HR 65 | Temp 96.0°F | Resp 19 | Ht 64.0 in | Wt 147.0 lb

## 2018-04-14 DIAGNOSIS — Z1212 Encounter for screening for malignant neoplasm of rectum: Secondary | ICD-10-CM | POA: Diagnosis present

## 2018-04-14 MED ORDER — SODIUM CHLORIDE 0.9 % IV SOLN: 500.0000 mL | Freq: Once | INTRAVENOUS | Status: DC

## 2018-04-14 NOTE — Patient Instructions (Signed)
YOU HAD AN ENDOSCOPIC PROCEDURE TODAY AT THE [facility]:   Refer to the procedure report that was given to you for any specific questions about what was found during the examination.  If the procedure report does not answer your questions, please call your gastroenterologist to clarify.  If you requested that your care partner not be given the details of your procedure findings, then the procedure report has been included in a sealed envelope for you to review at your convenience later.  YOU SHOULD EXPECT: Some feelings of bloating in the abdomen. Passage of more gas than usual.  Walking can help get rid of the air that was put into your GI tract during the procedure and reduce the bloating. If you had a lower endoscopy (such as a colonoscopy or flexible sigmoidoscopy) you may notice spotting of blood in your stool or on the toilet paper. If you underwent a bowel prep for your procedure, you may not have a normal bowel movement for a few days.  Please Note:  You might notice some irritation and congestion in your nose or some drainage.  This is from the oxygen used during your procedure.  There is no need for concern and it should clear up in a day or so.  SYMPTOMS TO REPORT IMMEDIATELY:   Following lower endoscopy (colonoscopy or flexible sigmoidoscopy):  Excessive amounts of blood in the stool  Significant tenderness or worsening of abdominal pains  Swelling of the abdomen that is new, acute  Fever of 100F or higher  For urgent or emergent issues, a gastroenterologist can be reached at any hour by calling [phone].   DIET:  We do recommend a small meal at first, but then you may proceed to your regular diet.  Drink plenty of fluids but you should avoid alcoholic beverages for 24 hours.  ACTIVITY:  You should plan to take it easy for the rest of today and you should NOT DRIVE or use heavy machinery until tomorrow (because of the sedation medicines used during the test).     FOLLOW UP: Our staff will call the number listed on your records the next business day following your procedure to check on you and address any questions or concerns that you may have regarding the information given to you following your procedure. If we do not reach you, we will leave a message.  However, if you are feeling well and you are not experiencing any problems, there is no need to return our call.  We will assume that you have returned to your regular daily activities without incident.  If any biopsies were taken you will be contacted by phone or by letter within the next 1-3 weeks.  Please call us at [phone] if you have not heard about the biopsies in 3 weeks.    SIGNATURES/CONFIDENTIALITY: You and/or your care partner have signed paperwork which will be entered into your electronic medical record.  These signatures attest to the fact that that the information above on your After Visit Summary has been reviewed and is understood.  Full responsibility of the confidentiality of this discharge information lies with you and/or your care-partner. 

## 2018-04-14 NOTE — Telephone Encounter (Signed)
Patient is concerned her prep was not sufficient. She is passing liquid that is dark but not clear. She is going to take her enema. She will inform the admitting staff.

## 2018-04-14 NOTE — Telephone Encounter (Signed)
Pt has flexsigmoidoscopy today at 1:30pm and has some questions about prep. Pls call her.

## 2018-04-14 NOTE — Op Note (Signed)
[facility] Patient Name: Natasha Chambers Procedure Date: 04/14/2018 1:41 PM MRN: [id number] Endoscopist: Mauri Pole , MD Age: 57 Referring MD:  Date of Birth: 1961/05/04 Gender: Female Account #: [id number] Procedure:                Flexible Sigmoidoscopy Indications:              Screening for malignant neoplasm in the rectum Medicines:                Monitored Anesthesia Care Procedure:                Pre-Anesthesia Assessment:                           - Prior to the procedure, a History and Physical                            was performed, and patient medications and                            allergies were reviewed. The patient's tolerance of                            previous anesthesia was also reviewed. The risks                            and benefits of the procedure and the sedation                            options and risks were discussed with the patient.                            All questions were answered, and informed consent                            was obtained. Prior Anticoagulants: The patient has                            taken no previous anticoagulant or antiplatelet                            agents. ASA Grade Assessment: II - A patient with                            mild systemic disease. After reviewing the risks                            and benefits, the patient was deemed in                            satisfactory condition to undergo the procedure.                           After obtaining informed consent, the scope was  passed under direct vision. The Model PCF-H190DL                            [id number]) scope was introduced through the anus                            and advanced to the 30 cm from the anal verge. The                            flexible sigmoidoscopy was accomplished without                            difficulty. The patient tolerated the procedure   well. The quality of the bowel preparation was                            excellent. Scope In: 1:45:53 PM Scope Out: 1:50:41 PM Scope Withdrawal Time: 0 hours 3 minutes 37 seconds  Total Procedure Duration: 0 hours 4 minutes 48 seconds  Findings:                 There was evidence of a prior functional end-to-end                            ileo-colonic anastomosis in the sigmoid colon. This                            was patent and was characterized by healthy                            appearing mucosa. The anastomosis was traversed.                           Non-bleeding internal hemorrhoids were found during                            retroflexion. The hemorrhoids were small.                           The exam was otherwise without abnormality. Complications:            No immediate complications. Estimated Blood Loss:     Estimated blood loss: none. Impression:               - Patent functional end-to-end ileo-colonic                            anastomosis, characterized by healthy appearing                            mucosa.                           - Non-bleeding internal hemorrhoids.                           - The examination was otherwise normal.                           -  No specimens collected. Recommendation:           - Patient has a contact number available for                            emergencies. The signs and symptoms of potential                            delayed complications were discussed with the                            patient. Return to normal activities tomorrow.                            Written discharge instructions were provided to the                            patient.                           - Resume previous diet.                           - Continue present medications.                           - Repeat flexible sigmoidoscopy in 10 years for                            screening purposes. Mauri Pole, MD 04/14/2018 1:56:36 PM This  report has been signed electronically.

## 2018-04-14 NOTE — Progress Notes (Signed)
Report to PACU, RN, vss, BBS= Clear.  

## 2018-04-14 NOTE — Progress Notes (Signed)
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

## 2018-04-14 NOTE — Telephone Encounter (Signed)
Left message to call back  

## 2018-04-15 ENCOUNTER — Telehealth: Payer: Self-pay | Admitting: *Deleted

## 2018-04-15 ENCOUNTER — Telehealth: Payer: Self-pay

## 2018-04-15 NOTE — Telephone Encounter (Signed)
  Follow up Call-  Call back number 04/14/2018  Post procedure Call Back phone  # [phone]  Permission to leave phone message Yes  Some recent data might be hidden     Patient questions:  Do you have a fever, pain , or abdominal swelling? No. Pain Score  0 *  Have you tolerated food without any problems? Yes.    Have you been able to return to your normal activities? Yes.    Do you have any questions about your discharge instructions: Diet   No. Medications  No. Follow up visit  No.  Do you have questions or concerns about your Care? No.  Actions: * If pain score is 4 or above: No action needed, pain <4.

## 2018-04-15 NOTE — Telephone Encounter (Signed)
  Follow up Call-  Call back number 04/14/2018  Post procedure Call Back phone  # [phone]  Permission to leave phone message Yes  Some recent data might be hidden     No answer, left message.

## 2018-06-30 ENCOUNTER — Ambulatory Visit (INDEPENDENT_AMBULATORY_CARE_PROVIDER_SITE_OTHER): Payer: BLUE CROSS/BLUE SHIELD | Admitting: Gastroenterology

## 2018-06-30 ENCOUNTER — Encounter: Payer: Self-pay | Admitting: Gastroenterology

## 2018-06-30 VITALS — BP 122/80 | HR 72 | Ht 64.0 in | Wt 145.0 lb

## 2018-06-30 DIAGNOSIS — K580 Irritable bowel syndrome with diarrhea: Secondary | ICD-10-CM | POA: Diagnosis not present

## 2018-06-30 DIAGNOSIS — Z9049 Acquired absence of other specified parts of digestive tract: Secondary | ICD-10-CM

## 2018-06-30 NOTE — Progress Notes (Signed)
Natasha Chambers    [id number]    [birth year]  Primary Care Physician:Nche, Charlene Brooke, NP  Referring Physician: Flossie Buffy, NP [place], [place] [address]  Chief complaint:  Diarrhea, Bloating  HPI: 57 yr F s/p sub total colectomy for severe chronic constipation with c/o diarrhea, IBS and bloating here for follow up visit.  Reports improvement of symptoms after she completed the course of Xifaxan.  She took probiotic VSL# 3 for 4 weeks.  She did not like taking Colestid or cholestyramine as she felt it was making her constipated and she did not like the feeling.  Currently having 3-4 bowel movements daily and is not taking Lomotil or Imodium.  She continues to have intermittent abdominal bloating and cramps but feels overall her symptoms have improved.  No rectal discomfort or rectal bleeding, anal fissure has healed  Flexible sigmoidoscopy May 2019 showed normal mucosa and anastomosis.  No rectal polyps.  Outpatient Encounter Medications as of 06/30/2018  Medication Sig  . AMBULATORY NON FORMULARY MEDICATION Medication Name: 0.125% Nitroglycerin ointment use pea sized amunt per rectum twice daily  . colestipol (COLESTID) 1 g tablet Take 2 tablets (2 g total) by mouth 3 (three) times daily. (Patient taking differently: Take 1 g by mouth daily. )  . Digestive Enzymes (BETAINE HCL) 650-130 MG CAPS Take by mouth 3 (three) times daily with meals.  . diphenhydrAMINE (BENADRYL) 50 MG tablet Take 50 mg by mouth at bedtime as needed for itching.  . Ginger, Zingiber officinalis, (GINGER PO) Take by mouth at bedtime as needed.  . Probiotic Product (PROBIOTIC DAILY PO) Take 1 tablet by mouth daily as needed.   . [DISCONTINUED] cholestyramine (QUESTRAN) 4 g packet Take 4 g by mouth as needed.  . [DISCONTINUED] diphenoxylate-atropine (LOMOTIL) 2.5-0.025 MG tablet Take 1 tablet by mouth every 6 (six) hours. As needed (Patient not taking: Reported on  04/14/2018)  . [DISCONTINUED] rifaximin (XIFAXAN) 550 MG TABS tablet Take 550 mg by mouth 3 (three) times daily.  . [DISCONTINUED] 0.9 %  sodium chloride infusion    No facility-administered encounter medications on file as of 06/30/2018.     Allergies as of 06/30/2018  . (No Known Allergies)    Past Medical History:  Diagnosis Date  . Anal fissure   . Anemia 2003  . Anxiety and depression   . Arthritis   . Bowel obstruction ([facility])   . Cervical cancer ([facility])   . Depression   . GERD (gastroesophageal reflux disease)   . Glaucoma   . HLD (hyperlipidemia)   . Hypertension   . IBS (irritable bowel syndrome)   . TMJ (dislocation of temporomandibular joint)     Past Surgical History:  Procedure Laterality Date  . ANAL FISTULECTOMY    . CERVICAL CONE BIOPSY    . COLECTOMY     total colectomy secondary to perforation   . MYOMECTOMY      Family History  Problem Relation Age of Onset  . Prostate cancer Father   . Diabetes Father   . Heart disease Father   . Depression Father   . Alcohol abuse Father   . Arthritis Father   . Early death Father   . Hearing loss Father   . Hyperlipidemia Father   . Hypertension Father   . Diabetes Brother   . Depression Brother   . Hypertension Brother   . Lung cancer Mother   . Depression Mother   .  COPD Mother   . Early death Mother   . Hypertension Mother   . Non-Hodgkin's lymphoma Mother   . Alzheimer's disease Paternal Uncle   . Lung cancer Paternal Grandfather   . Early death Maternal Grandmother   . Lung cancer Maternal Grandmother   . Cancer Maternal Grandfather        type unknown  . Cancer Paternal Grandmother        type unknown    Social History   Socioeconomic History  . Marital status: Married    Spouse name: Not on file  . Number of children: 0  . Years of education: Not on file  . Highest education level: Not on file  Occupational History  . Occupation: Retired  Scientific laboratory technician  . Financial resource strain:  Not on file  . Food insecurity:    Worry: Not on file    Inability: Not on file  . Transportation needs:    Medical: Not on file    Non-medical: Not on file  Tobacco Use  . Smoking status: Former Smoker    Last attempt to quit: 2013    Years since quitting: 6.5  . Smokeless tobacco: Never Used  Substance and Sexual Activity  . Alcohol use: Not Currently  . Drug use: No  . Sexual activity: Not Currently  Lifestyle  . Physical activity:    Days per week: Not on file    Minutes per session: Not on file  . Stress: Not on file  Relationships  . Social connections:    Talks on phone: Not on file    Gets together: Not on file    Attends religious service: Not on file    Active member of club or organization: Not on file    Attends meetings of clubs or organizations: Not on file    Relationship status: Not on file  . Intimate partner violence:    Fear of current or ex partner: Not on file    Emotionally abused: Not on file    Physically abused: Not on file    Forced sexual activity: Not on file  Other Topics Concern  . Not on file  Social History Narrative  . Not on file      Review of systems: Review of Systems  Constitutional: Negative for fever and chills.  Positive for lack of energy HENT: Negative.   Eyes: Negative for blurred vision.  Respiratory: Negative for cough, shortness of breath and wheezing.   Cardiovascular: Negative for chest pain and palpitations.  Gastrointestinal: as per HPI Genitourinary: Negative for dysuria, urgency, frequency and hematuria.  Musculoskeletal: Positive for myalgias, back pain and joint pain.  Skin: Negative for itching and rash.  Neurological: Negative for dizziness, tremors, focal weakness, seizures and loss of consciousness.  Endo/Heme/Allergies: Positive for seasonal allergies.  Psychiatric/Behavioral: Negative for  suicidal ideas and hallucinations.  Positive for depression All other systems reviewed and are  negative.   Physical Exam: Vitals:   06/30/18 1107  BP: 122/80  Pulse: 72   Body mass index is 24.89 kg/m. Gen:      No acute distress HEENT:  EOMI, sclera anicteric Neck:     No masses; no thyromegaly Lungs:    Clear to auscultation bilaterally; normal respiratory effort CV:         Regular rate and rhythm; no murmurs Abd:      + bowel sounds; soft, non-tender; no palpable masses, no distension Ext:    No edema; adequate peripheral perfusion  Skin:      Warm and dry; no rash Neuro: alert and oriented x 3 Psych: normal mood and affect  Data Reviewed:  Reviewed labs, radiology imaging, old records and pertinent past GI work up   Assessment and Plan/Recommendations:  57 year old female status post subtotal colectomy with chronic irritable bowel syndrome predominant diarrhea Significant improvement status post rifaximin and probiotic VSL#3 Advised patient to limit intake of highly refined carbohydrates, high fructose corn syrup for artificial sweeteners Lactose-free diet IBgard 1 capsule 3 times daily as needed for abdominal cramps and bloating Will increase to Colestid 1 g at bedtime to see if patient will tolerate it better and and can consider titrating up to 1 g twice daily  25 minutes was spent face-to-face with the patient. Greater than 50% of the time used for counseling as well as treatment plan and follow-up. She had multiple questions which were answered to her satisfaction  K. Denzil Magnuson , MD [phone]    CC: Nche, Charlene Brooke, NP

## 2018-06-30 NOTE — Patient Instructions (Signed)
Take IB Gard 1 capsule three times as needed  Continue Colestid 1 tablet daily at bedtime  Follow up in 4 months  Thank you for choosing [facility]  Kavitha Nandigam,MD

## 2018-07-06 ENCOUNTER — Encounter: Payer: Self-pay | Admitting: Gastroenterology

## 2023-05-24 NOTE — Progress Notes (Unsigned)
[facility] [address] [place] [address] Dept: [phone] Dept Fax: [phone]  New Patient Office Visit  Subjective:   Natasha Chambers 1961/06/24 05/25/2023  No chief complaint on file.   HPI: Natasha Chambers presents today to establish care at [facility]. Introduced to Publishing rights manager role and practice setting.  All questions answered.   Last PCP: *** Last annual physical: *** Concerns: See below    The following portions of the patient's history were reviewed and updated as appropriate: past medical history, past surgical history, family history, social history, allergies, medications, and problem list.   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   Past Medical History:  Diagnosis Date   Anal fissure    Anemia 2003   Anxiety and depression    Arthritis    Bowel obstruction ([facility])    Cervical cancer ([facility])    Depression    GERD (gastroesophageal reflux disease)    Glaucoma    HLD (hyperlipidemia)    Hypertension    IBS (irritable bowel syndrome)    TMJ (dislocation of temporomandibular joint)    Past Surgical History:  Procedure Laterality Date   ANAL FISTULECTOMY     CERVICAL CONE BIOPSY     COLECTOMY     total colectomy secondary to perforation    MYOMECTOMY     Family History  Problem Relation Age of Onset   Prostate cancer Father    Diabetes Father    Heart disease Father    Depression Father    Alcohol abuse Father    Arthritis Father    Early death Father    Hearing loss Father    Hyperlipidemia Father    Hypertension Father    Diabetes Brother    Depression Brother    Hypertension Brother    Lung cancer Mother    Depression Mother    COPD Mother    Early death Mother    Hypertension Mother     Non-Hodgkin's lymphoma Mother    Alzheimer's disease Paternal Uncle    Lung cancer Paternal Grandfather    Early death Maternal Grandmother    Lung cancer Maternal Grandmother    Cancer Maternal Grandfather        type unknown   Cancer Paternal Grandmother        type unknown   Outpatient Medications Prior to Visit  Medication Sig Dispense Refill   AMBULATORY NON FORMULARY MEDICATION Medication Name: 0.125% Nitroglycerin ointment use pea sized amunt per rectum twice daily 30 g 1   colestipol (COLESTID) 1 g tablet Take 2 tablets (2 g total) by mouth 3 (three) times daily. (Patient taking differently: Take 1 g by mouth daily. ) 90 tablet 3   Digestive Enzymes (BETAINE HCL) 650-130 MG CAPS Take by mouth 3 (three) times daily with meals.     diphenhydrAMINE (BENADRYL) 50 MG tablet Take 50 mg by mouth at bedtime as needed for itching.     Ginger, Zingiber officinalis, (GINGER PO) Take by mouth at bedtime as needed.     Probiotic Product (PROBIOTIC DAILY PO) Take 1 tablet by mouth daily as needed.      No facility-administered medications prior to visit.   No Known Allergies  ROS: A complete ROS was performed with pertinent positives/negatives noted in the HPI. The  remainder of the ROS are negative.   Objective:   There were no vitals filed for this visit.  GENERAL: Well-appearing, in NAD. Well nourished.  SKIN: Pink, warm and dry. No rash, lesion, ulceration, or ecchymoses.  NECK: Trachea midline. Full ROM w/o pain or tenderness. No lymphadenopathy.  RESPIRATORY: Chest wall symmetrical. Respirations even and non-labored. Breath sounds clear to auscultation bilaterally.  CARDIAC: S1, S2 present, regular rate and rhythm. Peripheral pulses 2+ bilaterally.  MSK: Muscle tone and strength appropriate for age. Joints w/o tenderness, redness, or swelling.  EXTREMITIES: Without clubbing, cyanosis, or edema.  NEUROLOGIC: No motor or sensory deficits. Steady, even gait.  PSYCH/MENTAL STATUS:  Alert, oriented x 3. Cooperative, appropriate mood and affect.   Health Maintenance Due  Topic Date Due   COVID-19 Vaccine (1) Never done   HIV Screening  Never done   Zoster Vaccines- Shingrix (1 of 2) Never done   MAMMOGRAM  12/01/2019   PAP SMEAR-Modifier  02/18/2021    No results found for any visits on 05/25/23.     Assessment & Plan:  There are no diagnoses linked to this encounter.   No follow-ups on file.   Salvatore Decent, FNP

## 2023-05-25 ENCOUNTER — Ambulatory Visit (INDEPENDENT_AMBULATORY_CARE_PROVIDER_SITE_OTHER): Payer: BC Managed Care – PPO | Admitting: Internal Medicine

## 2023-05-25 ENCOUNTER — Encounter: Payer: Self-pay | Admitting: Internal Medicine

## 2023-05-25 VITALS — BP 134/82 | HR 71 | Temp 98.5°F | Ht 64.0 in | Wt 140.6 lb

## 2023-05-25 DIAGNOSIS — L821 Other seborrheic keratosis: Secondary | ICD-10-CM

## 2023-05-27 ENCOUNTER — Encounter: Payer: BLUE CROSS/BLUE SHIELD | Admitting: Nurse Practitioner

## 2023-07-15 ENCOUNTER — Encounter (INDEPENDENT_AMBULATORY_CARE_PROVIDER_SITE_OTHER): Payer: Self-pay

## 2023-08-24 NOTE — Progress Notes (Unsigned)
Subjective:   Hina Grissom 28-Jan-1961  08/25/2023   CC: No chief complaint on file.   HPI: Natasha Chambers is a 62 y.o. female who presents for a routine health maintenance exam.  Labs collected at time of visit.    HEALTH SCREENINGS: - Pap smear: {Blank single:19197::"pap done","not applicable","up to date","done elsewhere"} - Mammogram (40+): {Blank single:19197::"Up to date","Ordered today","Not applicable","Refused","Done elsewhere"}  - Colonoscopy (45+): Up to date  - Bone Density (65+): Not applicable  - Lung CA screening with low-dose CT:  {Blank single:19197::"Up to date","Ordered today","Not applicable","Declined","Done elsewhere"} Adults age 49-80 who are current cigarette smokers or quit within the last 15 years. Must have 20 pack year history.   Depression and Anxiety Screen done today and results listed below:     05/25/2023    8:23 AM  Depression screen PHQ 2/9  Decreased Interest 0  Down, Depressed, Hopeless 0  PHQ - 2 Score 0  Altered sleeping 0  Tired, decreased energy 0  Change in appetite 0  Feeling bad or failure about yourself  0  Trouble concentrating 0  Moving slowly or fidgety/restless 0  Suicidal thoughts 0  PHQ-9 Score 0  Difficult doing work/chores Not difficult at all      05/25/2023    8:23 AM  GAD 7 : Generalized Anxiety Score  Nervous, Anxious, on Edge 0  Control/stop worrying 0  Worry too much - different things 0  Trouble relaxing 0  Restless 0  Easily annoyed or irritable 0  Afraid - awful might happen 0  Total GAD 7 Score 0  Anxiety Difficulty Not difficult at all    IMMUNIZATIONS: - Tdap: Tetanus vaccination status reviewed: last tetanus booster within 10 years. - Influenza: {Blank single:19197::"Up to date","Administered today","Postponed to flu season","Refused","Given elsewhere"} - Pneumovax: Not applicable - Prevnar 20: Not applicable - Zostavax (50+): {Blank single:19197::"Up to date","Administered  today","Not applicable","Refused","Given elsewhere"}   Past medical history, surgical history, medications, allergies, family history and social history reviewed with patient today and changes made to appropriate areas of the chart.   Past Medical History:  Diagnosis Date   Anal fissure    Anemia 2003   Anxiety and depression    Arthritis    Bowel obstruction (HCC)    Cervical cancer (HCC)    Depression    GERD (gastroesophageal reflux disease)    Glaucoma    HLD (hyperlipidemia)    Hypertension    IBS (irritable bowel syndrome)    TMJ (dislocation of temporomandibular joint)     Past Surgical History:  Procedure Laterality Date   ANAL FISTULECTOMY     CERVICAL CONE BIOPSY     COLECTOMY     total colectomy secondary to perforation    MYOMECTOMY      No current outpatient medications on file prior to visit.   No current facility-administered medications on file prior to visit.    No Known Allergies   Social History   Socioeconomic History   Marital status: Married    Spouse name: Not on file   Number of children: 0   Years of education: Not on file   Highest education level: Not on file  Occupational History   Occupation: Retired  Tobacco Use   Smoking status: Former    Current packs/day: 0.00    Types: Cigarettes    Quit date: 2013    Years since quitting: 11.7   Smokeless tobacco: Never  Substance and Sexual Activity   Alcohol use: Not Currently  Drug use: No   Sexual activity: Not Currently  Other Topics Concern   Not on file  Social History Narrative   Not on file   Social Determinants of Health   Financial Resource Strain: Not on file  Food Insecurity: Not on file  Transportation Needs: Not on file  Physical Activity: Not on file  Stress: Not on file  Social Connections: Not on file  Intimate Partner Violence: Not on file   Social History   Tobacco Use  Smoking Status Former   Current packs/day: 0.00   Types: Cigarettes   Quit  date: 2013   Years since quitting: 11.7  Smokeless Tobacco Never   Social History   Substance and Sexual Activity  Alcohol Use Not Currently    Family History  Problem Relation Age of Onset   Prostate cancer Father    Diabetes Father    Heart disease Father    Depression Father    Alcohol abuse Father    Arthritis Father    Early death Father    Hearing loss Father    Hyperlipidemia Father    Hypertension Father    Diabetes Brother    Depression Brother    Hypertension Brother    Lung cancer Mother    Depression Mother    COPD Mother    Early death Mother    Hypertension Mother    Non-Hodgkin's lymphoma Mother    Alzheimer's disease Paternal Uncle    Lung cancer Paternal Grandfather    Early death Maternal Grandmother    Lung cancer Maternal Grandmother    Cancer Maternal Grandfather        type unknown   Cancer Paternal Grandmother        type unknown     ROS: Denies fever, fatigue, unexplained weight loss/gain, hearing or vision changes, cardiac or respiratory complaints. Denies neurological deficits, musculoskeletal complaints, gastrointestinal or genitourinary complaints, mental health complaints, and skin changes.   Objective:   There were no vitals filed for this visit.  GENERAL APPEARANCE: Well-appearing, in NAD. Well nourished.  SKIN: Pink, warm and dry. Turgor normal. No rash, lesion, ulceration, or ecchymoses. Hair evenly distributed.  HEENT: HEAD: Normocephalic.  EYES: PERRLA. EOMI. Lids intact w/o defect. Sclera white, Conjunctiva pink w/o exudate.  EARS: External ear w/o redness, swelling, masses or lesions. EAC clear. TM's intact, translucent w/o bulging, appropriate landmarks visualized. Appropriate acuity to conversational tones.  NOSE: Septum midline w/o deformity. Nares patent, mucosa pink and non-inflamed w/o drainage. No sinus tenderness.  THROAT: Uvula midline. Oropharynx clear. Tonsils non-inflamed w/o exudate ***. Oral mucosa pink and  moist.  NECK: Supple, Trachea midline. Full ROM w/o pain or tenderness. No lymphadenopathy. Thyroid non-tender w/o enlargement or palpable masses.  BREASTS: Breasts pendulous, symmetrical, and w/o palpable masses. Nipples everted and w/o discharge. No rash or skin retraction. No axillary or supraclavicular lymphadenopathy.  RESPIRATORY: Chest wall symmetrical w/o masses. Respirations even and non-labored. Breath sounds clear to auscultation bilaterally. No wheezes, rales, rhonchi, or crackles. CARDIAC: S1, S2 present, regular rate and rhythm. No gallops, murmurs, rubs, or clicks. No carotid bruits. Capillary refill <2 seconds. Peripheral pulses 2+ bilaterally. GI: Abdomen soft w/o distention. Normoactive bowel sounds. No palpable masses or tenderness. No guarding or rebound tenderness. Liver and spleen w/o tenderness or enlargement. No CVA tenderness.  GU: *** External genitalia without erythema, lesions, or masses. No lymphadenopathy. Vaginal mucosa pink and moist without exudate, lesions, or ulcerations. Cervix pink without discharge. Cervical os closed. Uterus and adnexae  palpable, not enlarged, and w/o tenderness. No palpable masses.  MSK: Muscle tone and strength appropriate for age, w/o atrophy or abnormal movement.  EXTREMITIES: Active ROM intact, w/o tenderness, crepitus, or contracture. No obvious joint deformities or effusions. No clubbing, edema, or cyanosis.  NEUROLOGIC: CN's II-XII intact. Motor strength symmetrical with no obvious weakness. No sensory deficits. DTR's 2+ symmetric bilaterally. Steady, even gait.  PSYCH/MENTAL STATUS: Alert, oriented x 3. Cooperative, appropriate mood and affect.   Chaperoned by Mary Sella CMA ***  Results for orders placed or performed in visit on 02/18/18  CBC  Result Value Ref Range   WBC 6.8 4.0 - 10.5 K/uL   RBC 4.46 3.87 - 5.11 Mil/uL   Platelets 272.0 150.0 - 400.0 K/uL   Hemoglobin 13.1 12.0 - 15.0 g/dL   HCT 62.9 52.8 - 41.3 %   MCV 86.7  78.0 - 100.0 fl   MCHC 34.0 30.0 - 36.0 g/dL   RDW 24.4 01.0 - 27.2 %  Comprehensive metabolic panel  Result Value Ref Range   Sodium 140 135 - 145 mEq/L   Potassium 4.4 3.5 - 5.1 mEq/L   Chloride 106 96 - 112 mEq/L   CO2 27 19 - 32 mEq/L   Glucose, Bld 81 70 - 99 mg/dL   BUN 10 6 - 23 mg/dL   Creatinine, Ser 5.36 0.40 - 1.20 mg/dL   Total Bilirubin 0.5 0.2 - 1.2 mg/dL   Alkaline Phosphatase 55 39 - 117 U/L   AST 23 0 - 37 U/L   ALT 45 (H) 0 - 35 U/L   Total Protein 6.8 6.0 - 8.3 g/dL   Albumin 4.3 3.5 - 5.2 g/dL   Calcium 9.5 8.4 - 64.4 mg/dL   GFR 03.47 >42.59 mL/min  Lipid panel  Result Value Ref Range   Cholesterol 213 (H) 0 - 200 mg/dL   Triglycerides 563.8 (H) 0.0 - 149.0 mg/dL   HDL 75.64 >33.29 mg/dL   VLDL 51.8 (H) 0.0 - 84.1 mg/dL   Total CHOL/HDL Ratio 4    NonHDL 154.74   TSH  Result Value Ref Range   TSH 1.29 0.35 - 4.50 uIU/mL  Hepatitis C Antibody  Result Value Ref Range   Hepatitis C Ab NON-REACTIVE NON-REACTI   SIGNAL TO CUT-OFF 0.30 <1.00  LDL cholesterol, direct  Result Value Ref Range   Direct LDL 109.0 mg/dL  Cytology - PAP  Result Value Ref Range   Adequacy      Satisfactory for evaluation. The presence or absence of an endocervical / transformation zone component cannot be determined because of atrophy.   Diagnosis      NEGATIVE FOR INTRAEPITHELIAL LESIONS OR MALIGNANCY.   HPV NOT DETECTED    Material Submitted CervicoVaginal Pap [ThinPrep Imaged]     Assessment & Plan:  There are no diagnoses linked to this encounter.  No orders of the defined types were placed in this encounter.   PATIENT COUNSELING:  - Encouraged a healthy well-balanced diet. Patient may adjust caloric intake to maintain or achieve ideal body weight. May reduce intake of dietary saturated fat and total fat and have adequate dietary potassium and calcium preferably from fresh fruits, vegetables, and low-fat dairy products.   - Advised to avoid cigarette smoking. -  Discussed with the patient that most people either abstain from alcohol or drink within safe limits (<=14/week and <=4 drinks/occasion for males, <=7/weeks and <= 3 drinks/occasion for females) and that the risk for alcohol disorders and other health effects  rises proportionally with the number of drinks per week and how often a drinker exceeds daily limits. - Discussed cessation/primary prevention of drug use and availability of treatment for abuse.  - Discussed sexually transmitted diseases, avoidance of unintended pregnancy and contraceptive alternatives.  - Stressed the importance of regular exercise - Injury prevention: Discussed safety belts, safety helmets, smoke detector, smoking near bedding or upholstery.  - Dental health: Discussed importance of regular tooth brushing, flossing, and dental visits.   NEXT PREVENTATIVE PHYSICAL DUE IN 1 YEAR.  No follow-ups on file.  Salvatore Decent, FNP

## 2023-08-25 ENCOUNTER — Ambulatory Visit (INDEPENDENT_AMBULATORY_CARE_PROVIDER_SITE_OTHER): Payer: BC Managed Care – PPO | Admitting: Internal Medicine

## 2023-08-25 ENCOUNTER — Other Ambulatory Visit (HOSPITAL_COMMUNITY)
Admission: RE | Admit: 2023-08-25 | Discharge: 2023-08-25 | Disposition: A | Discharge status: Home / Self Care | Payer: BC Managed Care – PPO | Source: Ambulatory Visit | Attending: Internal Medicine | Admitting: Internal Medicine

## 2023-08-25 ENCOUNTER — Encounter: Payer: Self-pay | Admitting: Internal Medicine

## 2023-08-25 VITALS — BP 128/76 | HR 77 | Temp 98.6°F | Ht 64.0 in | Wt 138.2 lb

## 2023-08-25 DIAGNOSIS — Z87891 Personal history of nicotine dependence: Secondary | ICD-10-CM | POA: Diagnosis not present

## 2023-08-25 DIAGNOSIS — Z0000 Encounter for general adult medical examination without abnormal findings: Secondary | ICD-10-CM

## 2023-08-25 DIAGNOSIS — Z131 Encounter for screening for diabetes mellitus: Secondary | ICD-10-CM

## 2023-08-25 DIAGNOSIS — Z124 Encounter for screening for malignant neoplasm of cervix: Secondary | ICD-10-CM | POA: Insufficient documentation

## 2023-08-25 DIAGNOSIS — Z1231 Encounter for screening mammogram for malignant neoplasm of breast: Secondary | ICD-10-CM

## 2023-08-25 DIAGNOSIS — Z23 Encounter for immunization: Secondary | ICD-10-CM

## 2023-08-25 LAB — COMPREHENSIVE METABOLIC PANEL
ALT: 11 U/L (ref 0–35)
AST: 16 U/L (ref 0–37)
Albumin: 4.5 g/dL (ref 3.5–5.2)
Alkaline Phosphatase: 50 U/L (ref 39–117)
BUN: 10 mg/dL (ref 6–23)
CO2: 27 mEq/L (ref 19–32)
Calcium: 9.5 mg/dL (ref 8.4–10.5)
Chloride: 102 mEq/L (ref 96–112)
Creatinine, Ser: 1 mg/dL (ref 0.40–1.20)
GFR: 60.04 mL/min (ref >60.00)
Glucose, Bld: 65 mg/dL — ABNORMAL LOW (ref 70–99)
Potassium: 4.4 mEq/L (ref 3.5–5.1)
Sodium: 139 mEq/L (ref 135–145)
Total Bilirubin: 1 mg/dL (ref 0.2–1.2)
Total Protein: 7.1 g/dL (ref 6.0–8.3)

## 2023-08-25 LAB — CBC WITH DIFFERENTIAL/PLATELET
Basophils Absolute: 0 10*3/uL (ref 0.0–0.1)
Basophils Relative: 0.7 % (ref 0.0–3.0)
Eosinophils Absolute: 0.1 10*3/uL (ref 0.0–0.7)
Eosinophils Relative: 1.7 % (ref 0.0–5.0)
HCT: 42 % (ref 36.0–46.0)
Hemoglobin: 14.1 g/dL (ref 12.0–15.0)
Lymphocytes Relative: 27.2 % (ref 12.0–46.0)
Lymphs Abs: 1.5 10*3/uL (ref 0.7–4.0)
MCHC: 33.6 g/dL (ref 30.0–36.0)
MCV: 89.2 fl (ref 78.0–100.0)
Monocytes Absolute: 0.6 10*3/uL (ref 0.1–1.0)
Monocytes Relative: 10.2 % (ref 3.0–12.0)
Neutro Abs: 3.3 10*3/uL (ref 1.4–7.7)
Neutrophils Relative %: 60.2 % (ref 43.0–77.0)
Platelets: 259 10*3/uL (ref 150.0–400.0)
RBC: 4.7 Mil/uL (ref 3.87–5.11)
RDW: 12.5 % (ref 11.5–15.5)
WBC: 5.6 10*3/uL (ref 4.0–10.5)

## 2023-08-25 LAB — LIPID PANEL
Cholesterol: 238 mg/dL — ABNORMAL HIGH (ref 0–200)
HDL: 61.8 mg/dL (ref >39.00)
LDL Cholesterol: 137 mg/dL — ABNORMAL HIGH (ref 0–99)
NonHDL: 176.19
Total CHOL/HDL Ratio: 4
Triglycerides: 198 mg/dL — ABNORMAL HIGH (ref 0.0–149.0)
VLDL: 39.6 mg/dL (ref 0.0–40.0)

## 2023-08-25 LAB — TSH: TSH: 2.07 u[IU]/mL (ref 0.35–5.50)

## 2023-08-30 LAB — CYTOLOGY - PAP
Comment: NEGATIVE
Diagnosis: NEGATIVE
High risk HPV: NEGATIVE

## 2023-09-30 ENCOUNTER — Emergency Department (HOSPITAL_COMMUNITY)
Admission: EM | Admit: 2023-09-30 | Discharge: 2023-09-30 | Disposition: A | Discharge status: Home / Self Care | Payer: BC Managed Care – PPO | Attending: Emergency Medicine | Admitting: Emergency Medicine

## 2023-09-30 ENCOUNTER — Encounter (HOSPITAL_COMMUNITY): Payer: Self-pay

## 2023-09-30 ENCOUNTER — Other Ambulatory Visit: Payer: Self-pay

## 2023-09-30 ENCOUNTER — Emergency Department (HOSPITAL_COMMUNITY): Payer: BC Managed Care – PPO

## 2023-09-30 DIAGNOSIS — R42 Dizziness and giddiness: Secondary | ICD-10-CM | POA: Diagnosis present

## 2023-09-30 DIAGNOSIS — I10 Essential (primary) hypertension: Secondary | ICD-10-CM | POA: Insufficient documentation

## 2023-09-30 DIAGNOSIS — E860 Dehydration: Secondary | ICD-10-CM | POA: Diagnosis not present

## 2023-09-30 DIAGNOSIS — R197 Diarrhea, unspecified: Secondary | ICD-10-CM | POA: Insufficient documentation

## 2023-09-30 DIAGNOSIS — E871 Hypo-osmolality and hyponatremia: Secondary | ICD-10-CM

## 2023-09-30 DIAGNOSIS — N179 Acute kidney failure, unspecified: Secondary | ICD-10-CM | POA: Diagnosis not present

## 2023-09-30 DIAGNOSIS — N3000 Acute cystitis without hematuria: Secondary | ICD-10-CM

## 2023-09-30 DIAGNOSIS — D582 Other hemoglobinopathies: Secondary | ICD-10-CM | POA: Diagnosis not present

## 2023-09-30 LAB — COMPREHENSIVE METABOLIC PANEL
ALT: 24 U/L (ref 0–44)
AST: 26 U/L (ref 15–41)
Albumin: 4.8 g/dL (ref 3.5–5.0)
Alkaline Phosphatase: 75 U/L (ref 38–126)
Anion gap: 14 (ref 5–15)
BUN: 33 mg/dL — ABNORMAL HIGH (ref 8–23)
CO2: 12 mmol/L — ABNORMAL LOW (ref 22–32)
Calcium: 9.7 mg/dL (ref 8.9–10.3)
Chloride: 101 mmol/L (ref 98–111)
Creatinine, Ser: 1.98 mg/dL — ABNORMAL HIGH (ref 0.44–1.00)
GFR, Estimated: 28 mL/min — ABNORMAL LOW (ref >60)
Glucose, Bld: 131 mg/dL — ABNORMAL HIGH (ref 70–99)
Potassium: 4 mmol/L (ref 3.5–5.1)
Sodium: 127 mmol/L — ABNORMAL LOW (ref 135–145)
Total Bilirubin: 1.4 mg/dL — ABNORMAL HIGH (ref 0.3–1.2)
Total Protein: 9.2 g/dL — ABNORMAL HIGH (ref 6.5–8.1)

## 2023-09-30 LAB — URINALYSIS, ROUTINE W REFLEX MICROSCOPIC
Bilirubin Urine: NEGATIVE
Glucose, UA: NEGATIVE mg/dL
Ketones, ur: NEGATIVE mg/dL
Nitrite: NEGATIVE
Protein, ur: NEGATIVE mg/dL
Specific Gravity, Urine: 1.009 (ref 1.005–1.030)
WBC, UA: >50 WBC/hpf (ref 0–5)
pH: 5 (ref 5.0–8.0)

## 2023-09-30 LAB — C DIFFICILE QUICK SCREEN W PCR REFLEX
C Diff antigen: NEGATIVE
C Diff interpretation: NOT DETECTED
C Diff toxin: NEGATIVE

## 2023-09-30 LAB — BASIC METABOLIC PANEL
Anion gap: 12 (ref 5–15)
BUN: 29 mg/dL — ABNORMAL HIGH (ref 8–23)
CO2: 19 mmol/L — ABNORMAL LOW (ref 22–32)
Calcium: 8.6 mg/dL — ABNORMAL LOW (ref 8.9–10.3)
Chloride: 104 mmol/L (ref 98–111)
Creatinine, Ser: 1.26 mg/dL — ABNORMAL HIGH (ref 0.44–1.00)
GFR, Estimated: 48 mL/min — ABNORMAL LOW (ref >60)
Glucose, Bld: 91 mg/dL (ref 70–99)
Potassium: 3.8 mmol/L (ref 3.5–5.1)
Sodium: 135 mmol/L (ref 135–145)

## 2023-09-30 LAB — CBC
HCT: 50.2 % — ABNORMAL HIGH (ref 36.0–46.0)
Hemoglobin: 17.7 g/dL — ABNORMAL HIGH (ref 12.0–15.0)
MCH: 30 pg (ref 26.0–34.0)
MCHC: 35.3 g/dL (ref 30.0–36.0)
MCV: 85.1 fL (ref 80.0–100.0)
Platelets: 373 10*3/uL (ref 150–400)
RBC: 5.9 MIL/uL — ABNORMAL HIGH (ref 3.87–5.11)
RDW: 11.7 % (ref 11.5–15.5)
WBC: 11 10*3/uL — ABNORMAL HIGH (ref 4.0–10.5)
nRBC: 0 % (ref 0.0–0.2)

## 2023-09-30 LAB — LIPASE, BLOOD: Lipase: 33 U/L (ref 11–51)

## 2023-09-30 LAB — MAGNESIUM: Magnesium: 2.2 mg/dL (ref 1.7–2.4)

## 2023-09-30 MED ORDER — ONDANSETRON HCL 4 MG/2ML IJ SOLN
4.0000 mg | Freq: Once | INTRAMUSCULAR | Status: AC
  Administered 2023-09-30: 4 mg via INTRAVENOUS
  Filled 2023-09-30: qty 2

## 2023-09-30 MED ORDER — CEPHALEXIN 500 MG PO CAPS: 500.0000 mg | ORAL_CAPSULE | Freq: Four times a day (QID) | ORAL | 0 refills | Status: DC

## 2023-09-30 MED ORDER — LACTATED RINGERS IV BOLUS
1000.0000 mL | Freq: Once | INTRAVENOUS | Status: AC
  Administered 2023-09-30: 1000 mL via INTRAVENOUS

## 2023-09-30 MED ORDER — CEPHALEXIN 500 MG PO CAPS
500.0000 mg | ORAL_CAPSULE | Freq: Once | ORAL | Status: AC
Start: 2023-09-30 — End: 2023-09-30
  Administered 2023-09-30: 500 mg via ORAL
  Filled 2023-09-30: qty 1

## 2023-09-30 NOTE — ED Provider Triage Note (Signed)
Emergency Medicine Provider Triage Evaluation Note  Brytney Meindl , a 62 y.o. female  was evaluated in triage.  Pt complains of 3 to 4 days of watery diarrhea.  Started having muscle cramping yesterday and today has felt weak and lightheaded and had no energy.  Has had some nausea but no vomiting, has not seen blood in her stool, has tried to stay hydrated with water and electrolyte packet.  History of previous colectomy.  Review of Systems  Positive: Diarrhea, dizziness, weakness Negative: Abdominal pain, vomiting,  Physical Exam  BP 99/70   Pulse (!) 130   Temp 97.9 F (36.6 C) (Oral)   Resp 20   Ht 5\' 4"  (1.626 m)   Wt 62.6 kg   SpO2 99%   BMI 23.69 kg/m  Gen:   Awake, no distress but somewhat ill-appearing, appears fatigued Resp:  Normal effort, tachycardic with regular rhythm MSK:   Moves extremities without difficulty  Other:  Abdomen without focal tenderness or guarding  Medical Decision Making  Medically screening exam initiated at 9:02 AM.  Appropriate orders placed.  Hurshel Keys was informed that the remainder of the evaluation will be completed by another provider, this initial triage assessment does not replace that evaluation, and the importance of remaining in the ED until their evaluation is complete.  Discussed with triage RN, given patient's heart rate she is not appropriate to return to the waiting room and will need acute bed for continued treatment as soon as 1 is available, will remain on cardiac monitor in triage room.  Lab orders initiated   Legrand Rams 09/30/23 [id number]

## 2023-09-30 NOTE — Discharge Instructions (Signed)
1.  Follow-up your doctor within the next 1 to 2 days.  You need repeat lab work ideally tomorrow to recheck your kidney function, sodium and potassium. 2.  Continue to aggressively hydrate including adding salt into your hydration solution or through eating foods with some additional salt.  I have included instructions about hyponatremia for additional education. 3.  Return to the Emergency Department immediately if you feel weak dizzy lightheaded or other concerning symptoms develop.

## 2023-09-30 NOTE — ED Triage Notes (Signed)
Patient stated she has been having a lot of diarrhea for 3 days. Has leg cramps. Feels like her food is just sitting in her stomach. Feels light headed and dizzy. Generalized body weakness.

## 2023-09-30 NOTE — ED Provider Notes (Signed)
[place] [facility] AT [facility] Provider Note   CSN: [id number] Arrival date & time: 09/30/23  1308     History  Chief Complaint  Patient presents with   Dizziness    Tabiatha Timbs is a 62 y.o. female.   Dizziness    Patient has history of irritable bowel syndrome hypertension acid reflux hyperlipidemia, prior bowel obstruction, total colectomy for severe chronic constipation.  Patient presents ED with complaints of lightheadedness, weakness, vomiting as well as diarrhea.  Patient has a history of recurrent episodes of severe bouts of diarrhea.  Patient states that she gets profound diarrhea.  Usually it does not last as long.  She is not sure what particularly triggers it.  She started having trouble with feeling weak and lightheaded.  She said tumors to count episodes of watery diarrhea.  She also has had episodes of vomiting and nausea.  She does not feel like her food is going completely through.  She not having abdominal pain.  No fevers.  She has had prior colectomy surgery.  She has also had a bowel obstruction.  Home Medications Prior to Admission medications   Medication Sig Start Date End Date Taking? Authorizing Provider  melatonin 5 MG TABS Take 5-10 mg by mouth See admin instructions. [employer] brand time-released melatonin 5 mg tablets- Take 5-10 mg by mouth at bedtime as needed for sleep   Yes [provider]  ZZZQUIL 25 MG CAPS Take 25-50 mg by mouth at bedtime as needed (for sleep).   Yes [provider]      Allergies    Patient has no known allergies.    Review of Systems   Review of Systems  Neurological:  Positive for dizziness.    Physical Exam Updated Vital Signs BP 136/86 (BP Location: Right Arm)   Pulse 87   Temp 98.1 F (36.7 C) (Oral)   Resp 18   Ht 1.626 m (5\' 4" )   Wt 62.6 kg   SpO2 98%   BMI 23.69 kg/m  Physical Exam Vitals and nursing note reviewed.  Constitutional:       Appearance: She is well-developed.  HENT:     Head: Normocephalic and atraumatic.     Right Ear: External ear normal.     Left Ear: External ear normal.     Mouth/Throat:     Mouth: Mucous membranes are dry.  Eyes:     General: No scleral icterus.       Right eye: No discharge.        Left eye: No discharge.     Conjunctiva/sclera: Conjunctivae normal.  Neck:     Trachea: No tracheal deviation.  Cardiovascular:     Rate and Rhythm: Normal rate and regular rhythm.  Pulmonary:     Effort: Pulmonary effort is normal. No respiratory distress.     Breath sounds: Normal breath sounds. No stridor. No wheezing or rales.  Abdominal:     General: Bowel sounds are normal. There is no distension.     Palpations: Abdomen is soft.     Tenderness: There is no abdominal tenderness. There is no guarding or rebound.  Musculoskeletal:        General: No tenderness or deformity.     Cervical back: Neck supple.  Skin:    General: Skin is warm and dry.     Findings: No rash.  Neurological:     General: No focal deficit present.     Mental Status: She  is alert.     Cranial Nerves: No cranial nerve deficit, dysarthria or facial asymmetry.     Sensory: No sensory deficit.     Motor: No abnormal muscle tone or seizure activity.     Coordination: Coordination normal.  Psychiatric:        Mood and Affect: Mood normal.     ED Results / Procedures / Treatments   Labs (all labs ordered are listed, but only abnormal results are displayed) Labs Reviewed  CBC - Abnormal; Notable for the following components:      Result Value   WBC 11.0 (*)    RBC 5.90 (*)    Hemoglobin 17.7 (*)    HCT 50.2 (*)    All other components within normal limits  URINALYSIS, ROUTINE W REFLEX MICROSCOPIC - Abnormal; Notable for the following components:   APPearance HAZY (*)    Hgb urine dipstick SMALL (*)    Leukocytes,Ua LARGE (*)    Bacteria, UA FEW (*)    Non Squamous Epithelial 0-5 (*)    All other components  within normal limits  COMPREHENSIVE METABOLIC PANEL - Abnormal; Notable for the following components:   Sodium 127 (*)    CO2 12 (*)    Glucose, Bld 131 (*)    BUN 33 (*)    Creatinine, Ser 1.98 (*)    Total Protein 9.2 (*)    Total Bilirubin 1.4 (*)    GFR, Estimated 28 (*)    All other components within normal limits  C DIFFICILE QUICK SCREEN W PCR REFLEX    GASTROINTESTINAL PANEL BY PCR, STOOL (REPLACES STOOL CULTURE)  MAGNESIUM  LIPASE, BLOOD  BASIC METABOLIC PANEL    EKG EKG Interpretation Date/Time:  Thursday September 30 2023 08:42:56 EDT Ventricular Rate:  131 PR Interval:  147 QRS Duration:  78 QT Interval:  290 QTC Calculation: 429 R Axis:   71  Text Interpretation: Sinus tachycardia Atrial premature complexes Consider right atrial enlargement Minimal ST depression, diffuse leads No previous tracing Confirmed by Gwyneth Sprout ([id number]) on 09/30/2023 8:53:09 AM  Radiology CT ABDOMEN PELVIS WO CONTRAST  Result Date: 09/30/2023 CLINICAL DATA:  Bowel obstruction suspected. Diarrhea. Weakness. Nausea. EXAM: CT ABDOMEN AND PELVIS WITHOUT CONTRAST TECHNIQUE: Multidetector CT imaging of the abdomen and pelvis was performed following the standard protocol without IV contrast. RADIATION DOSE REDUCTION: This exam was performed according to the departmental dose-optimization program which includes automated exposure control, adjustment of the mA and/or kV according to patient size and/or use of iterative reconstruction technique. COMPARISON:  None Available. FINDINGS: Lower chest: There are at least 2, sub 5 mm calcified granulomas in the visualized bilateral lungs. The lung bases are otherwise clear. No pleural effusion. The heart is normal in size. No pericardial effusion. Hepatobiliary: The liver is normal in size. Non-cirrhotic configuration. No suspicious mass. No intrahepatic or extrahepatic bile duct dilation. No calcified gallstones. Normal gallbladder wall thickness. No  pericholecystic inflammatory changes. Pancreas: Unremarkable. No pancreatic ductal dilatation or surrounding inflammatory changes. Spleen: Within normal limits. No focal lesion. Adrenals/Urinary Tract: Adrenal glands are unremarkable. No suspicious renal mass. No hydronephrosis. No renal or ureteric calculi. Unremarkable urinary bladder. Stomach/Bowel: There is a small sliding hiatal hernia. There is partial colectomy with side-to-side ileocolonic anastomosis in the left lower quadrant. No disproportionate dilation of the small or large bowel loops. No evidence of abnormal bowel wall thickening or inflammatory changes. The appendix is surgically absent. Vascular/Lymphatic: No ascites or pneumoperitoneum. No abdominal or pelvic lymphadenopathy, by size  criteria. No aneurysmal dilation of the major abdominal arteries. There are mild peripheral atherosclerotic vascular calcifications of the aorta and its major branches. 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 No large adnexal mass seen. Other: There is a tiny fat containing umbilical hernia. The soft tissues and abdominal wall are otherwise unremarkable. Musculoskeletal: No suspicious osseous lesions. Old healed deformity of left superior pubic ramus noted. IMPRESSION: *No acute inflammatory process identified within the abdomen or pelvis. No bowel obstruction. *Multiple other nonacute observations, as described above. Electronically Signed   By: Jules Schick M.D.   On: 09/30/2023 14:53    Procedures Procedures    Medications Ordered in ED Medications  lactated ringers bolus 1,000 mL (0 mLs Intravenous Stopped 09/30/23 1613)  lactated ringers bolus 1,000 mL (0 mLs Intravenous Stopped 09/30/23 1613)  ondansetron (ZOFRAN) injection 4 mg (4 mg Intravenous Given 09/30/23 1150)    ED Course/ Medical Decision Making/ A&P Clinical Course as of 09/30/23 1648   Thu Sep 30, 2023  1115 Comprehensive metabolic panel(!) Metabolic panel notable for elevated BUN and creatinine.  Bicarb decreased  [JK]  1116 CBC(!) Labs notable for elevated hemoglobin [JK]  1116 Magnesium Magnesium normal [JK]  1531 Patient's.  Discussed findings with patient.  Initially plan admission to the hospital because of her metabolic acidosis and AKI.  Patient states she is feeling much better has been able to walk without difficulty.  She does not want to be admitted to the hospital.  Will repeat metabolic panel to recheck to see if there is improvement [JK]    Clinical Course User Index [JK] Linwood Dibbles, MD                                 Medical Decision Making Differential diagnosis includes but not limited to dehydration, electrolyte disturbance.  Vomiting and diarrhea may be related to viral illness, colitis, diverticulitis, bowel obstruction  Amount and/or Complexity of Data Reviewed Labs: ordered. Decision-making details documented in ED Course. Radiology: ordered.  Risk Prescription drug management.   Patient presented to the ED for evaluation of diarrhea and weakness.  Presentation concerning for the possibility of colitis diverticulitis bowel obstruction.  Patient's labs were notable for acute kidney injury and metabolic acidosis.  CT scan did not show any signs of obstruction or other acute abnormality.  Patient notably dehydrated and was treated with IV fluids.  Patient has not had any recurrent diarrhea symptoms have improved.  Initially plan admission to the hospital due to her dehydration and acute kidney injury.  Patient however states she was feeling much better and preferred to go home.  Will plan on repeat metabolic panel.  If improved that may be an option.  Care turned over to Dr Donnald Garre at shift change        Final Clinical Impression(s) / ED Diagnoses Final diagnoses:  Dehydration  AKI (acute kidney injury) (HCC)  Diarrhea, unspecified type     Rx / DC Orders ED Discharge Orders     None         Linwood Dibbles, MD 09/30/23 [phone]

## 2023-09-30 NOTE — ED Notes (Signed)
Lab called to add on urine culture.  ?

## 2023-09-30 NOTE — ED Provider Notes (Signed)
AKI, feeling better after hydration, repeating metabolic panel. Patient wishes to hydrate and go home. Consider d/c if improved. Physical Exam  BP (!) 143/80   Pulse 72   Temp 98 F (36.7 C) (Oral)   Resp 18   Ht 5\' 4"  (1.626 m)   Wt 62.6 kg   SpO2 100%   BMI 23.69 kg/m   Physical Exam  Procedures  Procedures  ED Course / MDM   Clinical Course as of 09/30/23 1712  Thu Sep 30, 2023  1115 Comprehensive metabolic panel(!) Metabolic panel notable for elevated BUN and creatinine.  Bicarb decreased  [JK]  1116 CBC(!) Labs notable for elevated hemoglobin [JK]  1116 Magnesium Magnesium normal [JK]  1531 Patient's.  Discussed findings with patient.  Initially plan admission to the hospital because of her metabolic acidosis and AKI.  Patient states she is feeling much better has been able to walk without difficulty.  She does not want to be admitted to the hospital.  Will repeat metabolic panel to recheck to see if there is improvement [JK]    Clinical Course User Index [JK] Linwood Dibbles, MD   Medical Decision Making Amount and/or Complexity of Data Reviewed Labs: ordered. Decision-making details documented in ED Course. Radiology: ordered.  Risk Prescription drug management.   Metabolic panel has improved.  Sodium is now 135, GFR 48 potassium 3.8.  Patient reports feeling much better.  She she is now making urine and has been up and ambulatory.  Pressures are stable.  Patient is clinically well in appearance.  Review of urinalysis does show 50 WBCs with good specimen.  At this time with combination of AKI and also positive UA, will opt to treat with Keflex and obtain culture.  She is counseled that she needs repeat lab work tomorrow for kidney function, sodium and potassium.  She is aware of the urine culture and tailoring treatment based on results.         Arby Barrette, MD 09/30/23 [phone]

## 2023-10-01 ENCOUNTER — Encounter: Payer: Self-pay | Admitting: Internal Medicine

## 2023-10-01 ENCOUNTER — Telehealth: Payer: Self-pay

## 2023-10-01 ENCOUNTER — Ambulatory Visit (INDEPENDENT_AMBULATORY_CARE_PROVIDER_SITE_OTHER): Payer: BC Managed Care – PPO | Admitting: Internal Medicine

## 2023-10-01 VITALS — BP 136/84 | HR 86 | Temp 98.6°F | Ht 64.0 in | Wt 137.4 lb

## 2023-10-01 DIAGNOSIS — N3001 Acute cystitis with hematuria: Secondary | ICD-10-CM | POA: Diagnosis not present

## 2023-10-01 DIAGNOSIS — N179 Acute kidney failure, unspecified: Secondary | ICD-10-CM

## 2023-10-01 DIAGNOSIS — E871 Hypo-osmolality and hyponatremia: Secondary | ICD-10-CM

## 2023-10-01 LAB — BASIC METABOLIC PANEL
BUN: 28 mg/dL — ABNORMAL HIGH (ref 6–23)
CO2: 25 meq/L (ref 19–32)
Calcium: 9.4 mg/dL (ref 8.4–10.5)
Chloride: 100 meq/L (ref 96–112)
Creatinine, Ser: 1.07 mg/dL (ref 0.40–1.20)
GFR: 55.32 mL/min — ABNORMAL LOW (ref >60.00)
Glucose, Bld: 86 mg/dL (ref 70–99)
Potassium: 3.6 meq/L (ref 3.5–5.1)
Sodium: 135 meq/L (ref 135–145)

## 2023-10-01 LAB — GASTROINTESTINAL PANEL BY PCR, STOOL (REPLACES STOOL CULTURE)
Adenovirus F40/41: DETECTED — AB
Astrovirus: NOT DETECTED
Campylobacter species: NOT DETECTED
Cryptosporidium: NOT DETECTED
Cyclospora cayetanensis: NOT DETECTED
Entamoeba histolytica: NOT DETECTED
Enteroaggregative E coli (EAEC): NOT DETECTED
Enteropathogenic E coli (EPEC): DETECTED — AB
Enterotoxigenic E coli (ETEC): NOT DETECTED
Giardia lamblia: NOT DETECTED
Norovirus GI/GII: NOT DETECTED
Plesimonas shigelloides: NOT DETECTED
Rotavirus A: NOT DETECTED
Salmonella species: NOT DETECTED
Sapovirus (I, II, IV, and V): NOT DETECTED
Shiga like toxin producing E coli (STEC): NOT DETECTED
Shigella/Enteroinvasive E coli (EIEC): NOT DETECTED
Vibrio cholerae: NOT DETECTED
Vibrio species: NOT DETECTED
Yersinia enterocolitica: NOT DETECTED

## 2023-10-01 LAB — URINE CULTURE

## 2023-10-01 NOTE — Transitions of Care (Post Inpatient/ED Visit) (Signed)
   10/01/2023  Name: Natasha Chambers MRN: [id number] DOB: [birth year]  Today's TOC FU Call Status: Today's TOC FU Call Status:: Successful TOC FU Call Completed TOC FU Call Complete Date: 10/01/23 Patient's Name and Date of Birth confirmed.  Transition Care Management Follow-up Telephone Call Date of Discharge: 09/30/23 Discharge Facility: [facility]) Type of Discharge: Emergency Department How have you been since you were released from the hospital?: Better Any questions or concerns?: Yes  Items Reviewed: Did you receive and understand the discharge instructions provided?: Yes Medications obtained,verified, and reconciled?: Yes (Medications Reviewed) Any new allergies since your discharge?: No Dietary orders reviewed?: Yes Do you have support at home?: Yes  Medications Reviewed Today: Medications Reviewed Today   Medications were not reviewed in this encounter     Home Care and Equipment/Supplies: Were Home Health Services Ordered?: NA Any new equipment or medical supplies ordered?: NA  Functional Questionnaire: Do you need assistance with bathing/showering or dressing?: No Do you need assistance with meal preparation?: No Do you need assistance with eating?: No Do you have difficulty maintaining continence: No Do you need assistance with getting out of bed/getting out of a chair/moving?: No Do you have difficulty managing or taking your medications?: No  Follow up appointments reviewed: PCP Follow-up appointment confirmed?: Yes Date of PCP follow-up appointment?: 10/01/23 Follow-up Provider: [facility] Follow-up appointment confirmed?: NA Do you need transportation to your follow-up appointment?: Yes Do you understand care options if your condition(s) worsen?: Yes-patient verbalized understanding    SIGNATURE Arvil Persons, BSN, RN

## 2023-10-01 NOTE — Progress Notes (Signed)
[facility]ARE [facility] [address] [place] [address] Dept: [phone] Dept Fax: [phone]  Acute Care Office Visit  Subjective:   Natasha Chambers 09-Feb-1961 10/01/2023  Chief Complaint  Patient presents with   Hospitalization Follow-up    HPI: Natasha Chambers is a 62 yo F who presents for ER follow up.  Patient was seen on 09/30/2023 at [facility] emergency room for recent episodes of diarrhea and vomiting over this past week.  Patient was diagnosed with dehydration causing an AKI, hyponatremia, and noted to have a UTI.  Patient does have a history of irritable bowel syndrome, prior bowel obstruction, and total colectomy.  She states every couple years she will have worsening IBS symptoms such as this past week.  During her emergency room stay, labs were repeated which did show an improvement of sodium level from 1 27-1 35, and improvement in her kidney function.  Kidney function went from creatinine 1.98 with BUN 33 and GFR 28, 2 creatinine 1.26 BUN 29 and GFR 48 after IV fluids.   She was given a prescription for Keflex for UTI.  Today she reports feeling much better since ER visit, she notices her stools are more formed.  She is able to keep down fluids and has eaten breakfast this morning with no difficulties. No fever, chills, abdominal pain, flank pain.  No further episodes of nausea or vomiting.  The following portions of the patient's history were reviewed and updated as appropriate: past medical history, past surgical history, family history, social history, allergies, medications, and problem list.   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   Past Medical History:  Diagnosis Date   Anal fissure    Anemia 2003   Anxiety and depression     Arthritis    Bowel obstruction (HCC)    Cervical cancer (HCC)    Depression    GERD (gastroesophageal reflux disease)    Glaucoma    HLD (hyperlipidemia)    Hypertension    IBS (irritable bowel syndrome)    TMJ (dislocation of temporomandibular joint)    Past Surgical History:  Procedure Laterality Date   ANAL FISTULECTOMY     CERVICAL CONE BIOPSY     COLECTOMY     total colectomy secondary to perforation    MYOMECTOMY     Family History  Problem Relation Age of Onset   Prostate cancer Father    Diabetes Father    Heart disease Father    Depression Father    Alcohol abuse Father    Arthritis Father    Early death Father    Hearing loss Father    Hyperlipidemia Father    Hypertension Father    Diabetes Brother    Depression Brother    Hypertension Brother    Lung cancer Mother    Depression Mother    COPD Mother    Early death Mother    Hypertension Mother    Non-Hodgkin's lymphoma Mother    Alzheimer's disease Paternal Uncle    Lung cancer Paternal Grandfather    Early death Maternal Grandmother    Lung cancer Maternal Grandmother    Cancer Maternal Grandfather        type unknown   Cancer Paternal Grandmother        type unknown  Current Outpatient Medications:    cephALEXin (KEFLEX) 500 MG capsule, Take 1 capsule (500 mg total) by mouth 4 (four) times daily., Disp: 28 capsule, Rfl: 0   melatonin 5 MG TABS, Take 5-10 mg by mouth See admin instructions. [employer] brand time-released melatonin 5 mg tablets- Take 5-10 mg by mouth at bedtime as needed for sleep, Disp: , Rfl:    ZZZQUIL 25 MG CAPS, Take 25-50 mg by mouth at bedtime as needed (for sleep)., Disp: , Rfl:  No Known Allergies   ROS: A complete ROS was performed with pertinent positives/negatives noted in the HPI. The remainder of the ROS are negative.    Objective:   Today's Vitals   10/01/23 0954  BP: 136/84  Pulse: 86  Temp: 98.6 F (37 C)  TempSrc: Temporal  SpO2: 98%  Weight:  137 lb 6.4 oz (62.3 kg)  Height: 5\' 4"  (1.626 m)    GENERAL: Well-appearing, in NAD. Well nourished.  SKIN: Pink, warm and dry. No rash. NECK: Trachea midline. Full ROM w/o pain or tenderness. No lymphadenopathy.  RESPIRATORY: Chest wall symmetrical. Respirations even and non-labored. Breath sounds clear to auscultation bilaterally.  CARDIAC: S1, S2 present, regular rate and rhythm. Peripheral pulses 2+ bilaterally.  GI: Abdomen soft, non-tender. Normoactive bowel sounds. No rebound tenderness. No hepatomegaly or splenomegaly. No CVA tenderness.  EXTREMITIES: Without clubbing, cyanosis, or edema.  PSYCH/MENTAL STATUS: Alert, oriented x 3. Cooperative, appropriate mood and affect.    No results found for any visits on 10/01/23.    Assessment & Plan:  1. AKI (acute kidney injury) (HCC) - Basic Metabolic Panel (BMET)  2. Hyponatremia - Basic Metabolic Panel (BMET)  3. Acute cystitis with hematuria -Continue Keflex as prescribed   Orders Placed This Encounter  Procedures   Basic Metabolic Panel (BMET)   Lab Orders         Basic Metabolic Panel (BMET)     No images are attached to the encounter or orders placed in the encounter.  Return in about 5 days (around 10/06/2023) for AKI, UTI - repeat lab work .   Salvatore Decent, FNP

## 2023-10-01 NOTE — Patient Instructions (Signed)
Continue drinking plenty of fluids , liquid IV, gatorade

## 2023-10-04 NOTE — Progress Notes (Unsigned)
[facility] [address] [place] [address] Dept: [phone] Dept Fax: [phone]  Acute Care Office Visit  Subjective:   Natasha Chambers September 29, 1961 10/05/2023  No chief complaint on file.   HPI: Discussed the use of AI scribe software for clinical note transcription with the patient, who gave verbal consent to proceed.  History of Present Illness             The following portions of the patient's history were reviewed and updated as appropriate: past medical history, past surgical history, family history, social history, allergies, medications, and problem list.   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   Past Medical History:  Diagnosis Date   Anal fissure    Anemia 2003   Anxiety and depression    Arthritis    Bowel obstruction (HCC)    Cervical cancer (HCC)    Depression    GERD (gastroesophageal reflux disease)    Glaucoma    HLD (hyperlipidemia)    Hypertension    IBS (irritable bowel syndrome)    TMJ (dislocation of temporomandibular joint)    Past Surgical History:  Procedure Laterality Date   ANAL FISTULECTOMY     CERVICAL CONE BIOPSY     COLECTOMY     total colectomy secondary to perforation    MYOMECTOMY     Family History  Problem Relation Age of Onset   Prostate cancer Father    Diabetes Father    Heart disease Father    Depression Father    Alcohol abuse Father    Arthritis Father    Early death Father    Hearing loss Father    Hyperlipidemia Father    Hypertension Father    Diabetes Brother    Depression Brother    Hypertension Brother    Lung cancer Mother    Depression Mother    COPD Mother    Early death Mother    Hypertension Mother    Non-Hodgkin's lymphoma Mother    Alzheimer's disease  Paternal Uncle    Lung cancer Paternal Grandfather    Early death Maternal Grandmother    Lung cancer Maternal Grandmother    Cancer Maternal Grandfather        type unknown   Cancer Paternal Grandmother        type unknown    Current Outpatient Medications:    cephALEXin (KEFLEX) 500 MG capsule, Take 1 capsule (500 mg total) by mouth 4 (four) times daily., Disp: 28 capsule, Rfl: 0   melatonin 5 MG TABS, Take 5-10 mg by mouth See admin instructions. [employer] brand time-released melatonin 5 mg tablets- Take 5-10 mg by mouth at bedtime as needed for sleep, Disp: , Rfl:    ZZZQUIL 25 MG CAPS, Take 25-50 mg by mouth at bedtime as needed (for sleep)., Disp: , Rfl:  No Known Allergies   ROS: A complete ROS was performed with pertinent positives/negatives noted in the HPI. The remainder of the ROS are negative.    Objective:   There were no vitals filed for this visit.  GENERAL: Well-appearing, in NAD. Well nourished.  SKIN: Pink, warm and dry. No rash, lesion, ulceration, or ecchymoses.  NECK: Trachea midline. Full ROM w/o pain or tenderness. No lymphadenopathy.  RESPIRATORY: Chest wall symmetrical. Respirations even and non-labored.  Breath sounds clear to auscultation bilaterally.  CARDIAC: S1, S2 present, regular rate and rhythm. Peripheral pulses 2+ bilaterally.  MSK: Muscle tone and strength appropriate for age. Joints w/o tenderness, redness, or swelling. EXTREMITIES: Without clubbing, cyanosis, or edema.  NEUROLOGIC: No motor or sensory deficits. Steady, even gait.  PSYCH/MENTAL STATUS: Alert, oriented x 3. Cooperative, appropriate mood and affect.    No results found for any visits on 10/05/23.    Assessment & Plan:  Assessment and Plan           No orders of the defined types were placed in this encounter.  No orders of the defined types were placed in this encounter.  Lab Orders  No laboratory test(s) ordered today   No images are attached to the encounter  or orders placed in the encounter.  No follow-ups on file.   Salvatore Decent, FNP

## 2023-10-05 ENCOUNTER — Encounter: Payer: Self-pay | Admitting: Internal Medicine

## 2023-10-05 ENCOUNTER — Ambulatory Visit (INDEPENDENT_AMBULATORY_CARE_PROVIDER_SITE_OTHER): Payer: BC Managed Care – PPO | Admitting: Internal Medicine

## 2023-10-05 VITALS — BP 134/70 | HR 67 | Temp 98.0°F | Ht 64.0 in | Wt 142.8 lb

## 2023-10-05 DIAGNOSIS — N179 Acute kidney failure, unspecified: Secondary | ICD-10-CM

## 2023-10-05 DIAGNOSIS — N3001 Acute cystitis with hematuria: Secondary | ICD-10-CM

## 2023-10-05 DIAGNOSIS — A044 Other intestinal Escherichia coli infections: Secondary | ICD-10-CM | POA: Diagnosis not present

## 2023-10-05 LAB — BASIC METABOLIC PANEL
BUN: 11 mg/dL (ref 6–23)
CO2: 30 meq/L (ref 19–32)
Calcium: 9.2 mg/dL (ref 8.4–10.5)
Chloride: 101 meq/L (ref 96–112)
Creatinine, Ser: 0.86 mg/dL (ref 0.40–1.20)
GFR: 71.89 mL/min (ref >60.00)
Glucose, Bld: 73 mg/dL (ref 70–99)
Potassium: 4 meq/L (ref 3.5–5.1)
Sodium: 140 meq/L (ref 135–145)

## 2023-10-14 ENCOUNTER — Other Ambulatory Visit (INDEPENDENT_AMBULATORY_CARE_PROVIDER_SITE_OTHER): Payer: BC Managed Care – PPO

## 2023-10-14 DIAGNOSIS — N3001 Acute cystitis with hematuria: Secondary | ICD-10-CM | POA: Diagnosis not present

## 2023-10-14 LAB — URINALYSIS, ROUTINE W REFLEX MICROSCOPIC
Bilirubin Urine: NEGATIVE
Hgb urine dipstick: NEGATIVE
Ketones, ur: NEGATIVE
Leukocytes,Ua: NEGATIVE
Nitrite: NEGATIVE
Specific Gravity, Urine: 1.02 (ref 1.000–1.030)
Total Protein, Urine: NEGATIVE
Urine Glucose: NEGATIVE
Urobilinogen, UA: 0.2 (ref 0.0–1.0)
pH: 6 (ref 5.0–8.0)

## 2024-08-24 NOTE — Progress Notes (Unsigned)
 Subjective:   Natasha Chambers 1961-10-07  08/25/2024   CC: No chief complaint on file.   HPI: Natasha Chambers is a 63 y.o. female who presents for a routine health maintenance exam.  Labs *** collected at time of visit.    HEALTH SCREENINGS: - Pap smear: up to date - Mammogram (40+): {Blank single:19197::Up to date,Ordered today,Not applicable,Refused,Done elsewhere}  - Colonoscopy (45+): Up to date  - Bone Density (65+): {Blank single:19197::Up to date,Ordered today,Not applicable,Refused,Done elsewhere}  - Lung CA screening with low-dose CT:  {Blank single:19197::Up to date,Ordered today,Not applicable,Declined,Done elsewhere} Adults age 56-80 who are current cigarette smokers or quit within the last 15 years. Must have 20 pack year history.   IMMUNIZATIONS: - Tdap: Tetanus vaccination status reviewed: last tetanus booster within 10 years. - HPV: Not applicable - Influenza: {Blank single:19197::Up to date,Administered today,Postponed to flu season,Refused,Given elsewhere} - Prevnar 20: {Blank single:19197::Up to date,Administered today,Not applicable,Refused,Given elsewhere} - Zostavax (50+): {Blank single:19197::Up to date,Administered today,Not applicable,Refused,Given elsewhere}   Past medical history, surgical history, medications, allergies, family history and social history reviewed with patient today and changes made to appropriate areas of the chart.   Social History   Socioeconomic History   Marital status: Married    Spouse name: Not on file   Number of children: 0   Years of education: Not on file   Highest education level: Associate degree: occupational, Scientist, product/process development, or vocational program  Occupational History   Occupation: Retired  Tobacco Use   Smoking status: Former    Current packs/day: 0.00    Types: Cigarettes    Quit date: 2013    Years since quitting: 12.7   Smokeless tobacco:  Never  Substance and Sexual Activity   Alcohol use: Not Currently   Drug use: No   Sexual activity: Not Currently  Other Topics Concern   Not on file  Social History Narrative   Not on file   Social Drivers of Health   Financial Resource Strain: Low Risk  (08/21/2024)   Overall Financial Resource Strain (CARDIA)    Difficulty of Paying Living Expenses: Not hard at all  Food Insecurity: No Food Insecurity (08/21/2024)   Hunger Vital Sign    Worried About Running Out of Food in the Last Year: Never true    Ran Out of Food in the Last Year: Never true  Transportation Needs: No Transportation Needs (08/21/2024)   PRAPARE - Administrator, Civil Service (Medical): No    Lack of Transportation (Non-Medical): No  Physical Activity: Insufficiently Active (08/21/2024)   Exercise Vital Sign    Days of Exercise per Week: 3 days    Minutes of Exercise per Session: 30 min  Stress: No Stress Concern Present (08/21/2024)   Natasha Chambers [employer] Occupational [employer] - Occupational Stress Questionnaire    Feeling of Stress: Only a little  Social Connections: Socially Integrated (08/21/2024)   Social Connection and Isolation Panel    Frequency of Communication with Friends and Family: More than three times a week    Frequency of Social Gatherings with Friends and Family: More than three times a week    Attends Religious Services: More than 4 times per year    Active Member of [employer] or Organizations: Yes    Attends Engineer, structural: More than 4 times per year    Marital Status: Married  Catering manager Violence: Not on file     Past Medical History:  Diagnosis Date   Anal fissure  Anemia 2003   Anxiety and depression    Arthritis    Bowel obstruction (HCC)    Cervical cancer (HCC)    Depression    GERD (gastroesophageal reflux disease)    Glaucoma    HLD (hyperlipidemia)    Hypertension    IBS (irritable bowel syndrome)    TMJ (dislocation of temporomandibular  joint)     Past Surgical History:  Procedure Laterality Date   ANAL FISTULECTOMY     CERVICAL CONE BIOPSY     COLECTOMY     total colectomy secondary to perforation    MYOMECTOMY      Current Outpatient Medications on File Prior to Visit  Medication Sig   cephALEXin  (KEFLEX ) 500 MG capsule Take 1 capsule (500 mg total) by mouth 4 (four) times daily.   melatonin 5 MG TABS Take 5-10 mg by mouth See admin instructions. [employer] brand time-released melatonin 5 mg tablets- Take 5-10 mg by mouth at bedtime as needed for sleep   ZZZQUIL 25 MG CAPS Take 25-50 mg by mouth at bedtime as needed (for sleep).   No current facility-administered medications on file prior to visit.    No Known Allergies  Family History  Problem Relation Age of Onset   Prostate cancer Father    Diabetes Father    Heart disease Father    Depression Father    Alcohol abuse Father    Arthritis Father    Early death Father    Hearing loss Father    Hyperlipidemia Father    Hypertension Father    Diabetes Brother    Depression Brother    Hypertension Brother    Lung cancer Mother    Depression Mother    COPD Mother    Early death Mother    Hypertension Mother    Non-Hodgkin's lymphoma Mother    Alzheimer's disease Paternal Uncle    Lung cancer Paternal Grandfather    Early death Maternal Grandmother    Lung cancer Maternal Grandmother    Cancer Maternal Grandfather        type unknown   Cancer Paternal Grandmother        type unknown     ROS: Denies fever, fatigue, unexplained weight loss/gain, hearing or vision changes, cardiac or respiratory complaints. Denies neurological deficits, musculoskeletal complaints, gastrointestinal or genitourinary complaints, mental health complaints, and skin changes.   Objective:   There were no vitals filed for this visit.  GENERAL APPEARANCE: Well-appearing, in NAD. Well nourished.  SKIN: Pink, warm and dry. Turgor normal. No rash, lesion, ulceration,  or ecchymoses. Hair evenly distributed.  HEENT: HEAD: Normocephalic.  EYES: PERRLA. EOMI. Lids intact w/o defect. Sclera white, Conjunctiva pink w/o exudate.  EARS: External ear w/o redness, swelling, masses or lesions. EAC clear. TM's intact, translucent w/o bulging, appropriate landmarks visualized. Appropriate acuity to conversational tones.  NOSE: Septum midline w/o deformity. Nares patent, mucosa pink and non-inflamed w/o drainage.  THROAT: Uvula midline. Oropharynx clear. Tonsils non-inflamed w/o exudate ***. Oral mucosa pink and moist.  NECK: Supple, Trachea midline. Full ROM w/o pain or tenderness. No lymphadenopathy. Thyroid  non-tender w/o enlargement or palpable masses.  BREASTS: Breasts pendulous, symmetrical, and w/o palpable masses. Nipples everted and w/o discharge. No rash or skin retraction. No axillary or supraclavicular lymphadenopathy.  RESPIRATORY: Chest wall symmetrical w/o masses. Respirations even and non-labored. Breath sounds clear to auscultation bilaterally. No wheezes, rales, rhonchi, or crackles. CARDIAC: S1, S2 present, regular rate and rhythm. No gallops, murmurs, rubs, or clicks. ***  carotid bruits. Capillary refill <2 seconds. Peripheral pulses 2+ bilaterally. GI: Abdomen soft w/o distention. Normoactive bowel sounds. No palpable masses or tenderness. No guarding or rebound tenderness. Liver and spleen w/o tenderness or enlargement. No CVA tenderness.  MSK: Muscle tone and strength appropriate for age, w/o atrophy or abnormal movement.  EXTREMITIES: Active ROM intact, w/o tenderness, crepitus, or contracture. No obvious joint deformities or effusions. No clubbing, edema, or cyanosis.  NEUROLOGIC: CN's II-XII intact. Motor strength symmetrical with no obvious weakness. No sensory deficits. Steady, even gait.  PSYCH/MENTAL STATUS: Alert, oriented x 3. Cooperative, appropriate mood and affect.     Depression and Anxiety Screen done today and results listed below:      10/01/2023    9:54 AM 08/25/2023    8:17 AM 05/25/2023    8:23 AM  Depression screen PHQ 2/9  Decreased Interest 0 0 0  Down, Depressed, Hopeless 0 0 0  PHQ - 2 Score 0 0 0  Altered sleeping  0 0  Tired, decreased energy  0 0  Change in appetite  0 0  Feeling bad or failure about yourself   0 0  Trouble concentrating  0 0  Moving slowly or fidgety/restless  0 0  Suicidal thoughts  0 0  PHQ-9 Score  0 0  Difficult doing work/chores  Not difficult at all Not difficult at all      08/25/2023    8:17 AM 05/25/2023    8:23 AM  GAD 7 : Generalized Anxiety Score  Nervous, Anxious, on Edge 0 0  Control/stop worrying 0 0  Worry too much - different things 0 0  Trouble relaxing 0 0  Restless 0 0  Easily annoyed or irritable 0 0  Afraid - awful might happen 0 0  Total GAD 7 Score 0 0  Anxiety Difficulty Not difficult at all Not difficult at all     Assessment & Plan:  Encounter for general adult medical examination without abnormal findings  Breast cancer screening by mammogram  Immunization due    No orders of the defined types were placed in this encounter.   PATIENT COUNSELING:  - Encouraged a healthy well-balanced diet. Patient may adjust caloric intake to maintain or achieve ideal body weight. May reduce intake of dietary saturated fat and total fat and have adequate dietary potassium and calcium preferably from fresh fruits, vegetables, and low-fat dairy products.   - Advised to avoid cigarette smoking. - Discussed with the patient that most people either abstain from alcohol or drink within safe limits (<=14/week and <=4 drinks/occasion for males, <=7/weeks and <= 3 drinks/occasion for females) and that the risk for alcohol disorders and other health effects rises proportionally with the number of drinks per week and how often a drinker exceeds daily limits. - Discussed cessation/primary prevention of drug use and availability of treatment for abuse.  - Discussed sexually  transmitted diseases, avoidance of unintended pregnancy and contraceptive alternatives.  - Stressed the importance of regular exercise - Injury prevention: Discussed safety belts, safety helmets, smoke detector, smoking near bedding or upholstery.  - Dental health: Discussed importance of regular tooth brushing, flossing, and dental visits.   NEXT PREVENTATIVE PHYSICAL DUE IN 1 YEAR.  No follow-ups on file.  Rosina Senters, FNP

## 2024-08-25 ENCOUNTER — Ambulatory Visit (INDEPENDENT_AMBULATORY_CARE_PROVIDER_SITE_OTHER): Payer: BC Managed Care – PPO | Admitting: Internal Medicine

## 2024-08-25 ENCOUNTER — Encounter: Payer: Self-pay | Admitting: Internal Medicine

## 2024-08-25 VITALS — BP 134/70 | HR 78 | Temp 98.0°F | Ht 64.0 in | Wt 147.2 lb

## 2024-08-25 DIAGNOSIS — R5383 Other fatigue: Secondary | ICD-10-CM | POA: Diagnosis not present

## 2024-08-25 DIAGNOSIS — Z0000 Encounter for general adult medical examination without abnormal findings: Secondary | ICD-10-CM

## 2024-08-25 DIAGNOSIS — Z23 Encounter for immunization: Secondary | ICD-10-CM

## 2024-08-25 DIAGNOSIS — Z1231 Encounter for screening mammogram for malignant neoplasm of breast: Secondary | ICD-10-CM

## 2024-08-25 DIAGNOSIS — H269 Unspecified cataract: Secondary | ICD-10-CM | POA: Insufficient documentation

## 2024-08-25 LAB — COMPREHENSIVE METABOLIC PANEL WITH GFR
ALT: 14 U/L (ref 0–35)
AST: 17 U/L (ref 0–37)
Albumin: 4.5 g/dL (ref 3.5–5.2)
Alkaline Phosphatase: 50 U/L (ref 39–117)
BUN: 14 mg/dL (ref 6–23)
CO2: 27 meq/L (ref 19–32)
Calcium: 9.4 mg/dL (ref 8.4–10.5)
Chloride: 104 meq/L (ref 96–112)
Creatinine, Ser: 0.94 mg/dL (ref 0.40–1.20)
GFR: 64.21 mL/min (ref >60.00)
Glucose, Bld: 77 mg/dL (ref 70–99)
Potassium: 3.9 meq/L (ref 3.5–5.1)
Sodium: 138 meq/L (ref 135–145)
Total Bilirubin: 0.7 mg/dL (ref 0.2–1.2)
Total Protein: 7.3 g/dL (ref 6.0–8.3)

## 2024-08-25 LAB — CBC WITH DIFFERENTIAL/PLATELET
Basophils Absolute: 0.1 K/uL (ref 0.0–0.1)
Basophils Relative: 1.1 % (ref 0.0–3.0)
Eosinophils Absolute: 0.4 K/uL (ref 0.0–0.7)
Eosinophils Relative: 6.7 % — ABNORMAL HIGH (ref 0.0–5.0)
HCT: 41.8 % (ref 36.0–46.0)
Hemoglobin: 14.1 g/dL (ref 12.0–15.0)
Lymphocytes Relative: 23.9 % (ref 12.0–46.0)
Lymphs Abs: 1.4 K/uL (ref 0.7–4.0)
MCHC: 33.9 g/dL (ref 30.0–36.0)
MCV: 87.3 fl (ref 78.0–100.0)
Monocytes Absolute: 0.6 K/uL (ref 0.1–1.0)
Monocytes Relative: 10.8 % (ref 3.0–12.0)
Neutro Abs: 3.3 K/uL (ref 1.4–7.7)
Neutrophils Relative %: 57.5 % (ref 43.0–77.0)
Platelets: 277 K/uL (ref 150.0–400.0)
RBC: 4.78 Mil/uL (ref 3.87–5.11)
RDW: 12.4 % (ref 11.5–15.5)
WBC: 5.7 K/uL (ref 4.0–10.5)

## 2024-08-25 LAB — IBC + FERRITIN
Ferritin: 27.6 ng/mL (ref 10.0–291.0)
Iron: 163 ug/dL — ABNORMAL HIGH (ref 42–145)
Saturation Ratios: 49.1 % (ref 20.0–50.0)
TIBC: 331.8 ug/dL (ref 250.0–450.0)
Transferrin: 237 mg/dL (ref 212.0–360.0)

## 2024-08-25 LAB — VITAMIN D 25 HYDROXY (VIT D DEFICIENCY, FRACTURES): VITD: 24.87 ng/mL — ABNORMAL LOW (ref 30.00–100.00)

## 2024-08-25 LAB — LIPID PANEL
Cholesterol: 260 mg/dL — ABNORMAL HIGH (ref 0–200)
HDL: 57.1 mg/dL (ref >39.00)
LDL Cholesterol: 155 mg/dL — ABNORMAL HIGH (ref 0–99)
NonHDL: 203.35
Total CHOL/HDL Ratio: 5
Triglycerides: 241 mg/dL — ABNORMAL HIGH (ref 0.0–149.0)
VLDL: 48.2 mg/dL — ABNORMAL HIGH (ref 0.0–40.0)

## 2024-08-25 LAB — TSH: TSH: 1.83 u[IU]/mL (ref 0.35–5.50)

## 2024-08-28 ENCOUNTER — Encounter: Payer: Self-pay | Admitting: Internal Medicine

## 2024-08-28 ENCOUNTER — Ambulatory Visit: Payer: Self-pay | Admitting: Internal Medicine

## 2024-08-28 DIAGNOSIS — E782 Mixed hyperlipidemia: Secondary | ICD-10-CM | POA: Insufficient documentation

## 2024-08-28 DIAGNOSIS — E559 Vitamin D deficiency, unspecified: Secondary | ICD-10-CM | POA: Insufficient documentation

## 2025-06-05 ENCOUNTER — Encounter: Admitting: Internal Medicine
